# Patient Record
Sex: Female | Born: 1990 | Race: Black or African American | Hispanic: No | Marital: Single
Health system: Southern US, Community
[De-identification: ages and names within clinical notes are randomized; demographics above are authoritative.]

## PROBLEM LIST (undated history)

## (undated) ENCOUNTER — Inpatient Hospital Stay: Payer: Self-pay

## (undated) DIAGNOSIS — F3176 Bipolar disorder, in full remission, most recent episode depressed: Secondary | ICD-10-CM

## (undated) DIAGNOSIS — N926 Irregular menstruation, unspecified: Secondary | ICD-10-CM

## (undated) DIAGNOSIS — F329 Major depressive disorder, single episode, unspecified: Secondary | ICD-10-CM

## (undated) DIAGNOSIS — D649 Anemia, unspecified: Secondary | ICD-10-CM

## (undated) HISTORY — PX: NO PAST SURGERIES: SHX2092

## (undated) HISTORY — DX: Bipolar disorder, in full remission, most recent episode depressed: F31.76

## (undated) HISTORY — DX: Irregular menstruation, unspecified: N92.6

## (undated) HISTORY — DX: Major depressive disorder, single episode, unspecified: F32.9

---
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
## 2012-02-10 ENCOUNTER — Other Ambulatory Visit: Payer: Self-pay | Admitting: Internal Medicine

## 2012-02-10 MED ORDER — ALPRAZOLAM ER 0.5 MG PO TB24: 0.5000 mg | ORAL_TABLET | ORAL | Status: DC

## 2012-02-28 ENCOUNTER — Encounter: Payer: Medicaid Other | Admitting: Internal Medicine

## 2012-02-28 DIAGNOSIS — Z0289 Encounter for other administrative examinations: Secondary | ICD-10-CM

## 2012-04-14 ENCOUNTER — Other Ambulatory Visit: Payer: Self-pay | Admitting: Internal Medicine

## 2012-09-04 ENCOUNTER — Ambulatory Visit (INDEPENDENT_AMBULATORY_CARE_PROVIDER_SITE_OTHER): Payer: Medicaid Other | Admitting: Internal Medicine

## 2012-09-04 ENCOUNTER — Encounter: Payer: Self-pay | Admitting: Internal Medicine

## 2012-09-04 VITALS — BP 110/70 | HR 86 | Temp 98.0°F | Resp 16 | Ht 62.0 in | Wt 241.0 lb

## 2012-09-04 DIAGNOSIS — Z01419 Encounter for gynecological examination (general) (routine) without abnormal findings: Secondary | ICD-10-CM | POA: Insufficient documentation

## 2012-09-04 DIAGNOSIS — F419 Anxiety disorder, unspecified: Secondary | ICD-10-CM

## 2012-09-04 DIAGNOSIS — K2970 Gastritis, unspecified, without bleeding: Secondary | ICD-10-CM

## 2012-09-04 DIAGNOSIS — R109 Unspecified abdominal pain: Secondary | ICD-10-CM

## 2012-09-04 DIAGNOSIS — Z789 Other specified health status: Secondary | ICD-10-CM

## 2012-09-04 DIAGNOSIS — E669 Obesity, unspecified: Secondary | ICD-10-CM

## 2012-09-04 DIAGNOSIS — Z0000 Encounter for general adult medical examination without abnormal findings: Secondary | ICD-10-CM | POA: Insufficient documentation

## 2012-09-04 DIAGNOSIS — Z1151 Encounter for screening for human papillomavirus (HPV): Secondary | ICD-10-CM | POA: Insufficient documentation

## 2012-09-04 DIAGNOSIS — F411 Generalized anxiety disorder: Secondary | ICD-10-CM

## 2012-09-04 DIAGNOSIS — IMO0002 Reserved for concepts with insufficient information to code with codable children: Secondary | ICD-10-CM

## 2012-09-04 LAB — POCT URINE PREGNANCY: Preg Test, Ur: NEGATIVE

## 2012-09-04 MED ORDER — ESOMEPRAZOLE MAGNESIUM 40 MG PO CPDR: 40.0000 mg | DELAYED_RELEASE_CAPSULE | Freq: Every day | ORAL | Status: DC

## 2012-09-04 MED ORDER — LAMOTRIGINE 100 MG PO TABS: ORAL_TABLET | ORAL | Status: DC

## 2012-09-04 MED ORDER — FLUOXETINE HCL 10 MG PO CAPS: 30.0000 mg | ORAL_CAPSULE | Freq: Every day | ORAL | Status: DC

## 2012-09-04 MED ORDER — NORETHIN-ETH ESTRAD TRIPHASIC 0.5/1/0.5-35 MG-MCG PO TABS: 1.0000 | ORAL_TABLET | Freq: Every day | ORAL | Status: DC

## 2012-09-04 NOTE — Patient Instructions (Addendum)
Start the nexium in the morning daily for gastritis resume prozac and lamictal for your depression and bipolar disorder  If you continue to have trouble sleeping,  E mail me and I will [facility] a [facility]  Resume birth control  Follow up in one month   This is  One version of a  "Low GI"  Diet:  It will still lower your blood sugars and allow you to lose 4 to 8  lbs  per month if you follow it carefully.  Your goal with exercise is a minimum of 30 minutes of aerobic exercise 5 days per week (Walking does not count once it becomes easy!)    All of the foods can be found at grocery stores and in bulk at [employer].  The Atkins protein bars and shakes are available in more varieties at Target, WalMart and [employer]'s Foods.     7 AM Breakfast:  Choose from the following:  Low carbohydrate Protein  Shakes (I recommend the EAS AdvantEdge "Carb Control" shakes  Or the low carb shakes by Atkins.    2.5 carbs   Arnold's "Sandwhich Thin"toasted  w/ peanut butter (no jelly: about 20 net carbs  "Bagel Thin" with cream cheese and salmon: about 20 carbs   a scrambled egg/bacon/cheese burrito made with Mission's "carb balance" whole wheat tortilla  (about 10 net carbs )   Avoid cereal and bananas, oatmeal and cream of wheat and grits. They are loaded with carbohydrates!   10 AM: high protein snack  Protein bar by Atkins (the snack size, under 200 cal, usually < 6 net carbs).    A stick of cheese:  Around 1 carb,  100 cal     Dannon Light n Fit [place] Yogurt  (80 cal, 8 carbs)  Other so called "protein bars" and Greek yogurts tend to be loaded with carbohydrates.  Remember, in food advertising, the word "energy" is synonymous for " carbohydrate."  Lunch:   A Sandwich using the bread choices listed, Can use any  Eggs,  lunchmeat, grilled meat or canned tuna), avocado, regular mayo/mustard  and cheese.  A Salad using blue cheese, ranch,  Goddess or vinagrette,  No croutons or "confetti" and no "candied  nuts" but regular nuts OK.   No pretzels or chips.  Pickles and miniature sweet peppers are a good low carb alternative that provide a "crunch"  The bread is the only source of carbohydrate in a sandwich and  can be decreased by trying some of these alternatives to traditional loaf bread  Joseph's makes a pita bread and a flat bread that are 50 cal and 4 net carbs available at BJs and WalMart.  This can be toasted to use with hummous as well  Toufayan makes a low carb flatbread that's 100 cal and 9 net carbs available at [employer] and [employer] makes 2 sizes of  Low carb whole wheat tortilla  (The large one is 210 cal and 6 net carbs) Avoid "Low fat dressings, as well as Reyne Dumas and [address] dressings They are loaded with sugar!   3 PM/ Mid day  Snack:  Consider  1 ounce of  almonds, walnuts, pistachios, pecans, peanuts,  Macadamia nuts or a nut medley.  Avoid "granola"; the dried cranberries and raisins are loaded with carbohydrates. Mixed nuts as long as there are no raisins,  cranberries or dried fruit.     6 PM  Dinner:     Meat/fowl/fish with  a green salad, and either broccoli, cauliflower, green beans, spinach, brussel sprouts or  Lima beans. DO NOT BREAD THE PROTEIN!!      There is a low carb pasta by Dreamfield's that is acceptable and tastes great: only 5 digestible carbs/serving.( All grocery stores but BJs carry it )  Try Kai Levins Angelo's chicken piccata or chicken or eggplant parm over low carb pasta.(Lowes and BJs)   Clifton Custard Sanchez's "Carnitas" (pulled pork, no sauce,  0 carbs) or his beef pot roast to make a dinner burrito (at BJ's)  Pesto over low carb pasta (bj's sells a good quality pesto in the center refrigerated section of the deli   Whole wheat pasta is still full of digestible carbs and  Not as low in glycemic index as Dreamfield's.   Brown rice is still rice,  So skip the rice and noodles if you eat [place] or [place] (or at least limit to 1/2 cup)  9 PM snack :    Breyer's "low carb" fudgsicle or  ice cream bar (Carb Smart line), or  Weight Watcher's ice cream bar , or another "no sugar added" ice cream;  a serving of fresh berries/cherries with whipped cream   Cheese or DANNON'S LlGHT N FIT GREEK YOGURT  Avoid bananas, pineapple, grapes  and watermelon on a regular basis because they are high in sugar.  THINK OF THEM AS DESSERT  Remember that snack Substitutions should be less than 10 NET carbs per serving and meals < 20 carbs. Remember to subtract fiber grams to get the "net carbs."

## 2012-09-04 NOTE — Progress Notes (Signed)
Patient ID: Pamela Robertson, female   DOB: [birth year], 22 y.o.   MRN: [id number]    Subjective:     Pamela Robertson is a 22 y.o. female and is here for a comprehensive physical exam. The patient reports Recurrent abdominal pain and nausea both post prandially and early in the morning,  Accompanied by a weight gain of 50 lbs since October.  Uses aleve  And motrin occasionally,. Does not smoke.   has been out of her medicationsioant for at least two months for unclear reasons.  .  History   Social History  . Marital Status: Single    Spouse Name: N/A    Number of Children: N/A  . Years of Education: N/A   Occupational History  . Not on file.   Social History Main Topics  . Smoking status: Former Smoker -- 0.10 packs/day    Types: Cigarettes    Quit date: 05/04/2011  . Smokeless tobacco: Never Used     Comment: not daily,  less than pack/month  . Alcohol Use: No  . Drug Use: No  . Sexually Active: Yes -- Female partner(s)    Birth Control/ Protection:    Other Topics Concern  . Not on file   Social History Narrative  . No narrative on file   Health Maintenance  Topic Date Due  . Chlamydia Screening  02/03/2008  . Pap Smear  02/03/2011  . Tetanus/tdap  02/03/2012  . Influenza Vaccine  02/01/2013    The following portions of the patient's history were reviewed and updated as appropriate: allergies, past family history, past medical history, past social history, past surgical history and problem list.  Review of Systems A comprehensive review of systems was negative.   Objective:   BP 110/70  Pulse 86  Temp(Src) 98 F (36.7 C) (Oral)  Resp 16  Ht 5\' 2"  (1.575 m)  Wt 241 lb (109.317 kg)  BMI 44.07 kg/m2  SpO2 99%  LMP 04/06/2012  General Appearance:    Alert, cooperative, no distress, appears stated age  Head:    Normocephalic, without obvious abnormality, atraumatic  Eyes:    PERRL, conjunctiva/corneas clear, EOM's intact, fundi    benign, both eyes  Ears:     Normal TM's and external ear canals, both ears  Nose:   Nares normal, septum midline, mucosa normal, no drainage    or sinus tenderness  Throat:   Lips, mucosa, and tongue normal; teeth and gums normal  Neck:   Supple, symmetrical, trachea midline, no adenopathy;    thyroid:  no enlargement/tenderness/nodules; no carotid   bruit or JVD  Back:     Symmetric, no curvature, ROM normal, no CVA tenderness  Lungs:     Clear to auscultation bilaterally, respirations unlabored  Chest Wall:    No tenderness or deformity   Heart:    Regular rate and rhythm, S1 and S2 normal, no murmur, rub   or gallop  Breast Exam:    No tenderness, masses, or nipple abnormality  Abdomen:     Soft, non-tender, bowel sounds active all four quadrants,    no masses, no organomegaly  Genitalia:    Pelvic: cervix normal in appearance, external genitalia normal, no adnexal masses or tenderness, no cervical motion tenderness, rectovaginal septum normal, uterus normal size, shape, and consistency and vagina normal without discharge  Extremities:   Extremities normal, atraumatic, no cyanosis or edema  Pulses:   2+ and symmetric all extremities  Skin:   Skin color, texture, turgor  normal, no rashes or lesions  Lymph nodes:   Cervical, supraclavicular, and axillary nodes normal  Neurologic:   CNII-XII intact, normal strength, sensation and reflexes    throughout    Assessment:   Routine general medical examination at a health care facility Annual comprehensive exam was done including breast, pelvic and PAP smear. All screenings have been addressed .   Obesity (BMI 30-39.9) I have addressed  BMI and recommended a low glycemic index diet utilizing smaller more frequent meals to increase metabolism.  I have also recommended that patient start exercising with a goal of 30 minutes of aerobic exercise a minimum of 5 days per week. Screening for lipid disorders, thyroid and diabetes were doen today and hgba1c is 6.0.    Unspecified gastritis and gastroduodenitis without mention of hemorrhage Trial of nexium. LFTS and H Pylori were normal.    Updated Medication List Outpatient Encounter Prescriptions as of 09/04/2012  Medication Sig Dispense Refill  . ALPRAZolam (ALPRAZOLAM XR) 0.5 MG 24 hr tablet Take 1 tablet (0.5 mg total) by mouth every morning.  30 tablet  5  . FLUoxetine (PROZAC) 10 MG capsule Take 3 capsules (30 mg total) by mouth daily. Take 3 by mouth every am  90 capsule  5  . lamoTRIgine (LAMICTAL) 100 MG tablet TAKE 1 TABLET BY MOUTH  Daily in the evening  30 tablet  3  . Norethindrone-Ethinyl Estradiol Triphasic (TRI-NORINYL, 28,) 0.5/1/0.5-35 MG-MCG tablet Take 1 tablet by mouth daily.  1 Package  11  . omeprazole (PRILOSEC) 20 MG capsule Take 20 mg by mouth daily.      . [DISCONTINUED] FLUoxetine (PROZAC) 10 MG capsule Take 3 capsules (30 mg total) by mouth daily. Take 3 by mouth every am  90 capsule  5  . [DISCONTINUED] lamoTRIgine (LAMICTAL) 100 MG tablet TAKE 1 TABLET BY MOUTH FOR HEADACHE  30 tablet  3  . [DISCONTINUED] Norethindrone-Ethinyl Estradiol Triphasic (TRI-NORINYL, 28,) 0.5/1/0.5-35 MG-MCG tablet Take 1 tablet by mouth daily.  1 Package  11  . esomeprazole (NEXIUM) 40 MG capsule Take 1 capsule (40 mg total) by mouth daily.  30 capsule  3   No facility-administered encounter medications on file as of 09/04/2012.

## 2012-09-05 LAB — COMPREHENSIVE METABOLIC PANEL
Albumin: 4.4 g/dL (ref 3.5–5.2)
Alkaline Phosphatase: 73 U/L (ref 39–117)
BUN: 17 mg/dL (ref 6–23)
Creat: 0.76 mg/dL (ref 0.50–1.10)
Glucose, Bld: 105 mg/dL — ABNORMAL HIGH (ref 70–99)
Potassium: 4.3 mEq/L (ref 3.5–5.3)

## 2012-09-05 LAB — LIPID PANEL
HDL: 52 mg/dL (ref >39)
LDL Cholesterol: 74 mg/dL (ref 0–99)
Total CHOL/HDL Ratio: 2.7 Ratio
Triglycerides: 80 mg/dL (ref <150)
VLDL: 16 mg/dL (ref 0–40)

## 2012-09-05 LAB — HEMOGLOBIN A1C: Hgb A1c MFr Bld: 6 % — ABNORMAL HIGH (ref <5.7)

## 2012-09-06 ENCOUNTER — Encounter: Payer: Self-pay | Admitting: Internal Medicine

## 2012-09-06 DIAGNOSIS — K2970 Gastritis, unspecified, without bleeding: Secondary | ICD-10-CM | POA: Insufficient documentation

## 2012-09-06 DIAGNOSIS — K2990 Gastroduodenitis, unspecified, without bleeding: Secondary | ICD-10-CM | POA: Insufficient documentation

## 2012-09-06 NOTE — Assessment & Plan Note (Addendum)
I have addressed  BMI and recommended a low glycemic index diet utilizing smaller more frequent meals to increase metabolism.  I have also recommended that patient start exercising with a goal of 30 minutes of aerobic exercise a minimum of 5 days per week. Screening for lipid disorders, thyroid and diabetes were doen today and hgba1c is 6.0.

## 2012-09-06 NOTE — Assessment & Plan Note (Signed)
Annual comprehensive exam was done including breast, pelvic and PAP smear. All screenings have been addressed .  

## 2012-09-06 NOTE — Assessment & Plan Note (Addendum)
Trial of nexium. LFTS and H Pylori were normal.

## 2012-09-07 NOTE — Progress Notes (Signed)
Called pt LMOVM to return call.  

## 2012-09-09 ENCOUNTER — Telehealth: Payer: Self-pay | Admitting: Internal Medicine

## 2012-09-09 ENCOUNTER — Other Ambulatory Visit (HOSPITAL_COMMUNITY)
Admission: RE | Admit: 2012-09-09 | Discharge: 2012-09-09 | Disposition: A | Discharge status: Home / Self Care | Payer: Medicaid Other | Source: Ambulatory Visit | Attending: Internal Medicine | Admitting: Internal Medicine

## 2012-09-10 ENCOUNTER — Telehealth: Payer: Self-pay | Admitting: *Deleted

## 2012-09-10 NOTE — Telephone Encounter (Signed)
Prior Authorization criteria question have been faxed over

## 2012-09-14 ENCOUNTER — Encounter: Payer: Self-pay | Admitting: General Practice

## 2012-09-16 ENCOUNTER — Telehealth: Payer: Self-pay | Admitting: Internal Medicine

## 2012-09-16 NOTE — Telephone Encounter (Signed)
Pt came in and wanted to get her rx for sleeping meds. [facility]

## 2012-09-16 NOTE — Telephone Encounter (Signed)
Called patient to verify which medication she is requesting and was advised all she knows is it starts with a T she thought. Please Advise.

## 2012-09-17 NOTE — Telephone Encounter (Signed)
Patient notified as instructed by phone. Patient stated she would discuss this further at up coming appointment.

## 2012-09-17 NOTE — Telephone Encounter (Signed)
I HAVE NOT PRESCRIBED HER A SLEEPING MED. She is is mutliple medications for her bipolar disorder per her last visit she had not been taking them regularly, and they have all been refilled. She will have to be more specific

## 2012-09-20 ENCOUNTER — Telehealth: Payer: Self-pay | Admitting: Internal Medicine

## 2012-09-20 MED ORDER — OMEPRAZOLE 40 MG PO CPDR: 40.0000 mg | DELAYED_RELEASE_CAPSULE | Freq: Every day | ORAL | Status: DC

## 2012-09-20 NOTE — Telephone Encounter (Signed)
Medicard denied coverage of Nexium bc we had not tried an "accepted" PPI ,  rx for omeprazole 40 mg sent to pharmacy.  Take daily in the am 30 minutes prior to food and drink

## 2012-09-21 NOTE — Telephone Encounter (Signed)
Patient returned call and was notified as instructed.

## 2012-09-21 NOTE — Telephone Encounter (Signed)
Left message for patient to call office.  

## 2012-10-05 ENCOUNTER — Ambulatory Visit (INDEPENDENT_AMBULATORY_CARE_PROVIDER_SITE_OTHER): Payer: Medicaid Other | Admitting: Internal Medicine

## 2012-10-05 ENCOUNTER — Encounter: Payer: Self-pay | Admitting: Internal Medicine

## 2012-10-05 VITALS — BP 112/70 | HR 80 | Temp 97.9°F | Resp 14 | Wt 240.5 lb

## 2012-10-05 DIAGNOSIS — F411 Generalized anxiety disorder: Secondary | ICD-10-CM

## 2012-10-05 DIAGNOSIS — K2970 Gastritis, unspecified, without bleeding: Secondary | ICD-10-CM

## 2012-10-05 DIAGNOSIS — F419 Anxiety disorder, unspecified: Secondary | ICD-10-CM

## 2012-10-05 DIAGNOSIS — F3162 Bipolar disorder, current episode mixed, moderate: Secondary | ICD-10-CM

## 2012-10-05 DIAGNOSIS — K2990 Gastroduodenitis, unspecified, without bleeding: Secondary | ICD-10-CM

## 2012-10-05 DIAGNOSIS — F489 Nonpsychotic mental disorder, unspecified: Secondary | ICD-10-CM

## 2012-10-05 DIAGNOSIS — F5105 Insomnia due to other mental disorder: Secondary | ICD-10-CM

## 2012-10-05 DIAGNOSIS — E669 Obesity, unspecified: Secondary | ICD-10-CM

## 2012-10-05 MED ORDER — ALPRAZOLAM 0.5 MG PO TABS: 0.5000 mg | ORAL_TABLET | Freq: Every evening | ORAL | Status: DC | PRN

## 2012-10-05 NOTE — Progress Notes (Signed)
Patient ID: Pamela Robertson, female   DOB: [birth year], 22 y.o.   MRN: [id number]   Patient Active Problem List   Diagnosis Date Noted  . 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 Bipolar I disorder, most recent episode (or current) mixed, moderate 10/06/2012  . Unspecified gastritis and gastroduodenitis without mention of hemorrhage 09/06/2012  . Routine general medical examination at a health care facility 09/04/2012  . Obesity (BMI 30-39.9) 12/23/2011  . 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 Depression   . Menstrual irregularity     Subjective:  CC:   Chief Complaint  Patient presents with  . Follow-up    3 month    HPI:   Pamela Robertson a 22 y.o. female who presents with persistent insomnia.  She has persistent difficulty falling asleep due to excessive worrying.  Does not fall asleep until 3 am many nights.  Boyfriend has commented on occasional snoring but no obvious apneic spells.  Patient is taking her lamictal as directed for management of bipolar disorder.  Describes  episodes of hyperventilating during the day, accompanied by shortness of breath.  Occurs with exertion, going up stairs, etc.,  Does not exercise,  Has not lost any weight .    Past Medical History  Diagnosis Date  . Bipolar 1 disorder, depressed, full remission     controlled with lamictal and prozac  . Depression 2010    hosp for suical ideation at [facility]   . Menstrual irregularity     occurring every 2 or 3 months    History reviewed. No pertinent past surgical history.     The following portions of the patient's history were reviewed and updated as appropriate: Allergies, current medications, and problem list.    Review of Systems:   Patient denies headache, fevers, malaise, unintentional weight loss, skin rash, eye pain, sinus congestion and sinus pain, sore throat, dysphagia,  hemoptysis , cough, dyspnea, wheezing, chest pain, palpitations, orthopnea, edema, abdominal pain, nausea,  melena, diarrhea, constipation, flank pain, dysuria, hematuria, urinary  Frequency, nocturia, numbness, tingling, seizures,  Focal weakness, Loss of consciousness,  Tremor,  and suicidal ideation.     History   Social History  . Marital Status: Single    Spouse Name: N/A    Number of Children: N/A  . Years of Education: N/A   Occupational History  . Not on file.   Social History Main Topics  . Smoking status: Former Smoker -- 0.10 packs/day    Types: Cigarettes    Quit date: 05/04/2011  . Smokeless tobacco: Never Used     Comment: not daily,  less than pack/month  . Alcohol Use: No  . Drug Use: No  . Sexually Active: Yes -- Female partner(s)    Birth Control/ Protection:    Other Topics Concern  . Not on file   Social History Narrative  . No narrative on file    Objective:  BP 112/70  Pulse 80  Temp(Src) 97.9 F (36.6 C) (Oral)  Resp 14  Wt 240 lb 8 oz (109.09 kg)  BMI 43.98 kg/m2  SpO2 99%  LMP 09/30/2012  General appearance: alert, cooperative and appears stated age Ears: normal TM's and external ear canals both ears Throat: lips, mucosa, and tongue normal; teeth and gums normal Neck: no adenopathy, no carotid bruit, supple, symmetrical, trachea midline and thyroid not enlarged, symmetric, no tenderness/mass/nodules Back: symmetric, no curvature. ROM normal. No CVA tenderness. Lungs: clear to auscultation bilaterally Heart: regular rate and rhythm, S1, S2 normal,  no murmur, click, rub or gallop Abdomen: soft, non-tender; bowel sounds normal; no masses,  no organomegaly Pulses: 2+ and symmetric Skin: Skin color, texture, turgor normal. No rashes or lesions Lymph nodes: Cervical, supraclavicular, and axillary nodes normal.  Assessment and Plan:  Insomnia secondary to anxiety patient is already taking an SSRI and lamictal for bipolar disorder.  Trial of alprazolam for bedtime use.   Obesity (BMI 30-39.9) I have addressed  BMI and recommended a low glycemic  index diet utilizing smaller more frequent meals to increase metabolism.  I have also recommended that patient start exercising with a goal of 30 minutes of aerobic exercise a minimum of 5 days per week.    Unspecified gastritis and gastroduodenitis without mention of hemorrhage Resolved with use of omeprazole   Bipolar I disorder, most recent episode (or current) mixed, moderate Has resumed medications.  Encouraged to resume relationship with psychologist for talk therapy.    Updated Medication List Outpatient Encounter Prescriptions as of 10/05/2012  Medication Sig Dispense Refill  . FLUoxetine (PROZAC) 10 MG capsule Take 3 capsules (30 mg total) by mouth daily. Take 3 by mouth every am  90 capsule  5  . lamoTRIgine (LAMICTAL) 100 MG tablet TAKE 1 TABLET BY MOUTH  Daily in the evening  30 tablet  3  . Norethindrone-Ethinyl Estradiol Triphasic (TRI-NORINYL, 28,) 0.5/1/0.5-35 MG-MCG tablet Take 1 tablet by mouth daily.  1 Package  11  . omeprazole (PRILOSEC) 40 MG capsule Take 1 capsule (40 mg total) by mouth daily.  30 capsule  3  . ALPRAZolam (XANAX) 0.5 MG tablet Take 1 tablet (0.5 mg total) by mouth at bedtime as needed for sleep or anxiety.  30 tablet  3  . [DISCONTINUED] ALPRAZolam (ALPRAZOLAM XR) 0.5 MG 24 hr tablet Take 1 tablet (0.5 mg total) by mouth every morning.  30 tablet  5   No facility-administered encounter medications on file as of 10/05/2012.     No orders of the defined types were placed in this encounter.    Return in about 4 weeks (around 11/02/2012).

## 2012-10-05 NOTE — Patient Instructions (Addendum)
I am prescribing short acting xanax to take at bedtime to help you relax.  Please ask your boyfriend if you stop breathing when you sleep  I want you to lose 4 lbs this month

## 2012-10-06 ENCOUNTER — Encounter: Payer: Self-pay | Admitting: Internal Medicine

## 2012-10-06 DIAGNOSIS — F5105 Insomnia due to other mental disorder: Secondary | ICD-10-CM | POA: Insufficient documentation

## 2012-10-06 DIAGNOSIS — F3162 Bipolar disorder, current episode mixed, moderate: Secondary | ICD-10-CM | POA: Insufficient documentation

## 2012-10-06 DIAGNOSIS — F419 Anxiety disorder, unspecified: Secondary | ICD-10-CM | POA: Insufficient documentation

## 2012-10-06 NOTE — Assessment & Plan Note (Signed)
Has resumed medications.  Encouraged to resume relationship with psychologist for talk therapy.

## 2012-10-06 NOTE — Assessment & Plan Note (Signed)
I have addressed  BMI and recommended a low glycemic index diet utilizing smaller more frequent meals to increase metabolism.  I have also recommended that patient start exercising with a goal of 30 minutes of aerobic exercise a minimum of 5 days per week.  

## 2012-10-06 NOTE — Assessment & Plan Note (Signed)
patient is already taking an SSRI and lamictal for bipolar disorder.  Trial of alprazolam for bedtime use.

## 2012-10-06 NOTE — Assessment & Plan Note (Signed)
Resolved with use of omeprazole

## 2012-11-03 ENCOUNTER — Ambulatory Visit: Payer: Medicaid Other | Admitting: Internal Medicine

## 2013-01-18 ENCOUNTER — Encounter: Payer: Self-pay | Admitting: Internal Medicine

## 2013-01-19 ENCOUNTER — Ambulatory Visit: Payer: Medicaid Other | Admitting: Internal Medicine

## 2013-01-19 ENCOUNTER — Other Ambulatory Visit: Payer: Self-pay | Admitting: Internal Medicine

## 2013-01-19 ENCOUNTER — Telehealth: Payer: Self-pay | Admitting: Internal Medicine

## 2013-01-19 NOTE — Telephone Encounter (Signed)
Pamela Robertson,  Can you address this patient's "no show and cancellation " history?  She has had 2 no shows and cancelled todays visit on the same day.    Thank you.,  TT

## 2013-01-19 NOTE — Telephone Encounter (Deleted)
I have researched her No show's and late cancellation, I also sent patient a letter.

## 2013-01-19 NOTE — Telephone Encounter (Signed)
I sent patient letter advising her of the 2 late cancellations and 3 no shows that she has had.

## 2013-06-25 ENCOUNTER — Other Ambulatory Visit: Payer: Self-pay | Admitting: Internal Medicine

## 2013-06-28 NOTE — Telephone Encounter (Signed)
Okay to refill? 

## 2013-06-28 NOTE — Telephone Encounter (Signed)
Refill denied,  Patient has had 3 no shows and 2 cancellations thus far.  No refills until she is seen.

## 2013-06-30 ENCOUNTER — Other Ambulatory Visit: Payer: Self-pay | Admitting: Internal Medicine

## 2013-10-01 ENCOUNTER — Encounter: Payer: Medicaid Other | Admitting: Internal Medicine

## 2013-10-21 ENCOUNTER — Encounter: Payer: Self-pay | Admitting: Internal Medicine

## 2013-10-21 ENCOUNTER — Ambulatory Visit (INDEPENDENT_AMBULATORY_CARE_PROVIDER_SITE_OTHER): Payer: Medicaid Other | Admitting: Internal Medicine

## 2013-10-21 VITALS — BP 102/70 | HR 79 | Temp 97.8°F | Resp 16 | Ht 61.75 in | Wt 249.5 lb

## 2013-10-21 DIAGNOSIS — N926 Irregular menstruation, unspecified: Secondary | ICD-10-CM

## 2013-10-21 DIAGNOSIS — R739 Hyperglycemia, unspecified: Secondary | ICD-10-CM

## 2013-10-21 DIAGNOSIS — F3162 Bipolar disorder, current episode mixed, moderate: Secondary | ICD-10-CM

## 2013-10-21 DIAGNOSIS — R7309 Other abnormal glucose: Secondary | ICD-10-CM

## 2013-10-21 DIAGNOSIS — E669 Obesity, unspecified: Secondary | ICD-10-CM

## 2013-10-21 DIAGNOSIS — F411 Generalized anxiety disorder: Secondary | ICD-10-CM

## 2013-10-21 DIAGNOSIS — F419 Anxiety disorder, unspecified: Secondary | ICD-10-CM

## 2013-10-21 DIAGNOSIS — Z0000 Encounter for general adult medical examination without abnormal findings: Secondary | ICD-10-CM

## 2013-10-21 LAB — COMPREHENSIVE METABOLIC PANEL
ALT: 19 U/L (ref 0–35)
AST: 17 U/L (ref 0–37)
Albumin: 3.8 g/dL (ref 3.5–5.2)
Alkaline Phosphatase: 66 U/L (ref 39–117)
BILIRUBIN TOTAL: 0.3 mg/dL (ref 0.2–1.2)
BUN: 16 mg/dL (ref 6–23)
CO2: 29 meq/L (ref 19–32)
CREATININE: 0.8 mg/dL (ref 0.4–1.2)
Calcium: 9.4 mg/dL (ref 8.4–10.5)
Chloride: 103 mEq/L (ref 96–112)
GFR: 116.77 mL/min (ref >60.00)
Glucose, Bld: 88 mg/dL (ref 70–99)
Potassium: 4.2 mEq/L (ref 3.5–5.1)
Sodium: 138 mEq/L (ref 135–145)
Total Protein: 7.4 g/dL (ref 6.0–8.3)

## 2013-10-21 LAB — LIPID PANEL
CHOL/HDL RATIO: 3
Cholesterol: 148 mg/dL (ref 0–200)
HDL: 45.2 mg/dL (ref >39.00)
LDL CALC: 95 mg/dL (ref 0–99)
Triglycerides: 40 mg/dL (ref 0.0–149.0)
VLDL: 8 mg/dL (ref 0.0–40.0)

## 2013-10-21 LAB — HEMOGLOBIN A1C: HEMOGLOBIN A1C: 5.9 % (ref 4.6–6.5)

## 2013-10-21 LAB — TESTOSTERONE: Testosterone: 64.96 ng/dL (ref 10.00–70.00)

## 2013-10-21 LAB — TSH: TSH: 1.92 u[IU]/mL (ref 0.35–5.50)

## 2013-10-21 MED ORDER — MOMETASONE FUROATE 0.1 % EX OINT: TOPICAL_OINTMENT | Freq: Every day | CUTANEOUS | Status: DC

## 2013-10-21 MED ORDER — FLUOXETINE HCL 10 MG PO CAPS: ORAL_CAPSULE | ORAL | Status: DC

## 2013-10-21 MED ORDER — PHENTERMINE HCL 37.5 MG PO TABS: ORAL_TABLET | ORAL | Status: DC

## 2013-10-21 MED ORDER — TRAZODONE HCL 50 MG PO TABS: 25.0000 mg | ORAL_TABLET | Freq: Every evening | ORAL | Status: DC | PRN

## 2013-10-21 MED ORDER — LAMOTRIGINE 100 MG PO TABS: ORAL_TABLET | ORAL | Status: DC

## 2013-10-21 NOTE — Progress Notes (Signed)
Patient ID: Pamela Robertson, female   DOB: 01/16/1991, 22 y.o.   MRN: [id number]    Subjective:     Pamela Robertson is a 23 y.o. female and is here for a comprehensive physical exam. The patient reports problems - as follows:  Has been amenorrheic for the past year  Currently menstruating since Monday .  Stopped birth control She lost 16 lbs and gained it back.  FH of obesity.. Requesting pharmacologic assistance     .  History   Social History  . Marital Status: Single    Spouse Name: N/A    Number of Children: N/A  . Years of Education: N/A   Occupational History  . Not on file.   Social History Main Topics  . Smoking status: Former Smoker -- 0.10 packs/day    Types: Cigarettes    Quit date: 05/04/2011  . Smokeless tobacco: Never Used     Comment: not daily,  less than pack/month  . Alcohol Use: No  . Drug Use: No  . Sexual Activity: Yes    Partners: Male    Birth Control/ Protection:    Other Topics Concern  . Not on file   Social History Narrative  . No narrative on file   Health Maintenance  Topic Date Due  . Chlamydia Screening  02/03/2008  . Tetanus/tdap  02/03/2012  . Influenza Vaccine  01/01/2014  . Pap Smear  09/05/2015    The following portions of the patient's history were reviewed and updated as appropriate: allergies, current medications, past family history, past medical history, past social history, past surgical history and problem list.  Review of Systems A comprehensive review of systems was negative.   Objective:   BP 102/70  Pulse 79  Temp(Src) 97.8 F (36.6 C) (Oral)  Resp 16  Ht 5' 1.75" (1.568 m)  Wt 249 lb 8 oz (113.172 kg)  BMI 46.03 kg/m2  SpO2 99%  LMP 10/18/2013  General appearance: alert, cooperative and appears stated age Head: Normocephalic, without obvious abnormality, atraumatic Eyes: conjunctivae/corneas clear. PERRL, EOM's intact. Fundi benign. Ears: normal TM's and external ear canals both ears Nose: Nares  normal. Septum midline. Mucosa normal. No drainage or sinus tenderness. Throat: lips, mucosa, and tongue normal; teeth and gums normal Neck: no adenopathy, no carotid bruit, no JVD, supple, symmetrical, trachea midline and thyroid not enlarged, symmetric, no tenderness/mass/nodules Lungs: clear to auscultation bilaterally Breasts: normal appearance, no masses or tenderness Heart: regular rate and rhythm, S1, S2 normal, no murmur, click, rub or gallop Abdomen: soft, non-tender; bowel sounds normal; no masses,  no organomegaly Extremities: extremities normal, atraumatic, no cyanosis or edema Pulses: 2+ and symmetric Skin: Skin color, texture, turgor normal. No rashes or lesions Neurologic: Alert and oriented X 3, normal strength and tone. Normal symmetric reflexes. Normal coordination and gait.    Assessment and Plan:    Routine general medical examination at a health care facility Annual comprehensive exam was done including breast, excluding pelvic and PAP smear. All screenings have been addressed .   Obesity (BMI 30-39.9) She has had difficulty losing weight due to increased appetite and is requesting a trial of  Phentermine.  She is aware of the possible side effects and risks and understands that    The medication will be discontinued if she has not lost 5% of her body weight over the next 3 months, which , based on today's weight is 14 lbs.  Bipolar I disorder, most recent episode (or current) mixed,  moderate She has had a lapse in medication due to loss of follow up .  Resuming zoloft and lamictal.   Updated Medication List Outpatient Encounter Prescriptions as of 10/21/2013  Medication Sig  . ALPRAZolam (XANAX) 0.5 MG tablet Take 1 tablet (0.5 mg total) by mouth at bedtime as needed for sleep or anxiety.  Marland Kitchen. FLUoxetine (PROZAC) 10 MG capsule Take 3 by mouth every am  . lamoTRIgine (LAMICTAL) 100 MG tablet TAKE 1 TABLET BY MOUTH  Daily in the evening  . omeprazole (PRILOSEC) 40 MG  capsule Take 1 capsule (40 mg total) by mouth daily.  . [DISCONTINUED] FLUoxetine (PROZAC) 10 MG capsule Take 3 capsules (30 mg total) by mouth daily. Take 3 by mouth every am  . [DISCONTINUED] lamoTRIgine (LAMICTAL) 100 MG tablet TAKE 1 TABLET BY MOUTH  Daily in the evening  . mometasone (ELOCON) 0.1 % ointment Apply topically daily. To ear canal  . Norethindrone-Ethinyl Estradiol Triphasic (TRI-NORINYL, 28,) 0.5/1/0.5-35 MG-MCG tablet Take 1 tablet by mouth daily.  . phentermine (ADIPEX-P) 37.5 MG tablet 1/2 tablet daily at 8 am and 2 pm  . traZODone (DESYREL) 50 MG tablet Take 0.5-1 tablets (25-50 mg total) by mouth at bedtime as needed for sleep.

## 2013-10-21 NOTE — Progress Notes (Signed)
Pre-visit discussion using our clinic review tool. No additional management support is needed unless otherwise documented below in the visit note.  

## 2013-10-21 NOTE — Patient Instructions (Addendum)
I am starting you on phentermine to help you lose weight,  Our goal with phentermine is a 12 lb wt loss over the next 3 months   Take 1/2 tablet in the morning and 1/2 tablet after lynch (by 2pM)  Have your vital signs rechecked one week after you start the medication and call the results to the office   We are resuming your zoloft and lamictal.  We will add trazodone at bedtime for your insomnia      Return in one month  Phentermine tablets or capsules What is this medicine? PHENTERMINE (FEN ter meen) decreases your appetite. It is used with a reduced calorie diet and exercise to help you lose weight. This medicine may be used for other purposes; ask your health care provider or pharmacist if you have questions. COMMON BRAND NAME(S): Adipex-P, Atti-Plex P , Atti-Plex P Spansule , Fastin, Pro-Fast, Tara-8  What should I tell my health care provider before I take this medicine? They need to know if you have any of these conditions: -agitation -glaucoma -heart disease -high blood pressure -history of substance abuse -lung disease called Primary Pulmonary Hypertension (PPH) -taken an MAOI like Carbex, Eldepryl, Marplan, Nardil, or Parnate in last 14 days -thyroid disease -an unusual or allergic reaction to phentermine, other medicines, foods, dyes, or preservatives -pregnant or trying to get pregnant -breast-feeding How should I use this medicine? Take this medicine by mouth with a glass of water. Follow the directions on the prescription label. This medicine is usually taken 30 minutes before or 1 to 2 hours after breakfast. Avoid taking this medicine in the evening. It may interfere with sleep. Take your doses at regular intervals. Do not take your medicine more often than directed. Talk to your pediatrician regarding the use of this medicine in children. Special care may be needed. Overdosage: If you think you have taken too much of this medicine contact a poison control center or  emergency room at once. NOTE: This medicine is only for you. Do not share this medicine with others. What if I miss a dose? If you miss a dose, take it as soon as you can. If it is almost time for your next dose, take only that dose. Do not take double or extra doses. What may interact with this medicine? Do not take this medicine with any of the following medications: -duloxetine -MAOIs like Carbex, Eldepryl, Marplan, Nardil, and Parnate -medicines for colds or breathing difficulties like pseudoephedrine or phenylephrine -procarbazine -sibutramine -SSRIs like citalopram, escitalopram, fluoxetine, fluvoxamine, paroxetine, and sertraline -stimulants like dexmethylphenidate, methylphenidate or modafinil -venlafaxine This medicine may also interact with the following medications: -medicines for diabetes This list may not describe all possible interactions. Give your health care provider a list of all the medicines, herbs, non-prescription drugs, or dietary supplements you use. Also tell them if you smoke, drink alcohol, or use illegal drugs. Some items may interact with your medicine. What should I watch for while using this medicine? Notify your physician immediately if you become short of breath while doing your normal activities. Do not take this medicine within 6 hours of bedtime. It can keep you from getting to sleep. Avoid drinks that contain caffeine and try to stick to a regular bedtime every night. This medicine was intended to be used in addition to a healthy diet and exercise. The best results are achieved this way. This medicine is only indicated for short-term use. Eventually your weight loss may level out. At that point, the  drug will only help you maintain your new weight. Do not increase or in any way change your dose without consulting your doctor. You may get drowsy or dizzy. Do not drive, use machinery, or do anything that needs mental alertness until you know how this medicine  affects you. Do not stand or sit up quickly, especially if you are an older patient. This reduces the risk of dizzy or fainting spells. Alcohol may increase dizziness and drowsiness. Avoid alcoholic drinks. What side effects may I notice from receiving this medicine? Side effects that you should report to your doctor or health care professional as soon as possible: -chest pain, palpitations -depression or severe changes in mood -increased blood pressure -irritability -nervousness or restlessness -severe dizziness -shortness of breath -problems urinating -unusual swelling of the legs -vomiting Side effects that usually do not require medical attention (report to your doctor or health care professional if they continue or are bothersome): -blurred vision or other eye problems -changes in sexual ability or desire -constipation or diarrhea -difficulty sleeping -dry mouth or unpleasant taste -headache -nausea This list may not describe all possible side effects. Call your doctor for medical advice about side effects. You may report side effects to FDA at 1-800-FDA-1088. Where should I keep my medicine? Keep out of the reach of children. This medicine can be abused. Keep your medicine in a safe place to protect it from theft. Do not share this medicine with anyone. Selling or giving away this medicine is dangerous and against the law. Store at room temperature between 20 and 25 degrees C (68 and 77 degrees F). Keep container tightly closed. Throw away any unused medicine after the expiration date. NOTE: This sheet is a summary. It may not cover all possible information. If you have questions about this medicine, talk to your doctor, pharmacist, or health care provider.  2014, Elsevier/Gold Standard. (2010-07-04 11:02:44)   This is  One version of a  "Low GI"  Diet:  It will still lower your blood sugars and allow you to lose 4 to 8  lbs  per month if you follow it carefully.  Your goal with  exercise is a minimum of 30 minutes of aerobic exercise 5 days per week (Walking does not count once it becomes easy!)    All of the foods can be found at grocery stores and in bulk at [employer].  The Atkins protein bars and shakes are available in more varieties at [employer], [employer] and [employer].     7 AM Breakfast:  Choose from the following:  Low carbohydrate Protein  Shakes (I recommend the [employer] "Carb Control" shakes  Or the low carb shakes by Atkins.    2.5 carbs   Arnold's "Sandwhich Thin"toasted  w/ peanut butter (no jelly: about 20 net carbs  "Bagel Thin" with cream cheese and salmon: about 20 carbs   a scrambled egg/bacon/cheese burrito made with Mission's "carb balance" whole wheat tortilla  (about 10 net carbs )   Avoid cereal and bananas, oatmeal and cream of wheat and grits. They are loaded with carbohydrates!   10 AM: high protein snack  Protein bar by Atkins (the snack size, under 200 cal, usually < 6 net carbs).    A stick of cheese:  Around 1 carb,  100 cal     Dannon Light n Fit [place] Yogurt  (80 cal, 8 carbs)  Other so called "protein bars" and Greek yogurts tend to be loaded with carbohydrates.  Remember, in food advertising, the word "energy" is synonymous for " carbohydrate."  Lunch:   A Sandwich using the bread choices listed, Can use any  Eggs,  lunchmeat, grilled meat or canned tuna), avocado, regular mayo/mustard  and cheese.  A Salad using blue cheese, ranch,  Goddess or vinagrette,  No croutons or "confetti" and no "candied nuts" but regular nuts OK.   No pretzels or chips.  Pickles and miniature sweet peppers are a good low carb alternative that provide a "crunch"  The bread is the only source of carbohydrate in a sandwich and  can be decreased by trying some of these alternatives to traditional loaf bread  Joseph's makes a pita bread and a flat bread that are 50 cal and 4 net carbs available at [employer]Js and [employer].  This can be toasted to use with  hummous as well  Toufayan makes a low carb flatbread that's 100 cal and 9 net carbs available at [employer] and [employer] makes 2 sizes of  Low carb whole wheat tortilla  (The large one is 210 cal and 6 net carbs) Avoid "Low fat dressings, as well as Reyne Dumas and [address] dressings They are loaded with sugar!   3 PM/ Mid day  Snack:  Consider  1 ounce of  almonds, walnuts, pistachios, pecans, peanuts,  Macadamia nuts or a nut medley.  Avoid "granola"; the dried cranberries and raisins are loaded with carbohydrates. Mixed nuts as long as there are no raisins,  cranberries or dried fruit.    Try the prosciutto/mozzarella cheese sticks by F[employer]  In deli /backery section   High protein      6 PM  Dinner:     Meat/fowl/fish with a green salad, and either broccoli, cauliflower, green beans, spinach, brussel sprouts or  Lima beans. DO NOT BREAD THE PROTEIN!!      There is a low carb pasta by Dreamfield's that is acceptable and tastes great: only 5 digestible carbs/serving.( All grocery stores but BJs carry it )  Try Kai Levins Angelo's chicken piccata or chicken or eggplant parm over low carb pasta.(Lowes and BJs)   Clifton Custard Sanchez's "Carnitas" (pulled pork, no sauce,  0 carbs) or his beef pot roast to make a dinner burrito (at [employer]J's)  Pesto over low carb pasta (bj's sells a good quality pesto in the center refrigerated section of the deli   Try satueeing  Roosvelt Harps with mushroooms  Whole wheat pasta is still full of digestible carbs and  Not as low in glycemic index as Dreamfield's.   Brown rice is still rice,  So skip the rice and noodles if you eat [place] or [place] (or at least limit to 1/2 cup)  9 PM snack :   Breyer's "low carb" fudgsicle or  ice cream bar (Carb Smart line), or  Weight Watcher's ice cream bar , or another "no sugar added" ice cream;  a serving of fresh berries/cherries with whipped cream   Cheese or DANNON'S LlGHT N FIT GREEK YOGURT  8 ounces of [place] unsweetened  almond/cococunut milk    Avoid bananas, pineapple, grapes  and watermelon on a regular basis because they are high in sugar.  THINK OF THEM AS DESSERT  Remember that snack Substitutions should be less than 10 NET carbs per serving and meals < 20 carbs. Remember to subtract fiber grams to get the "net carbs."

## 2013-10-22 ENCOUNTER — Encounter: Payer: Self-pay | Admitting: *Deleted

## 2013-10-22 LAB — DHEA-SULFATE: DHEA-SO4: 372 ug/dL (ref 35–430)

## 2013-10-24 NOTE — Assessment & Plan Note (Signed)
Annual comprehensive exam was done including breast, excluding pelvic and PAP smear. All screenings have been addressed .  

## 2013-10-24 NOTE — Assessment & Plan Note (Signed)
She has had difficulty losing weight due to increased appetite and is requesting a trial of  Phentermine.  She is aware of the possible side effects and risks and understands that    The medication will be discontinued if she has not lost 5% of her body weight over the next 3 months, which , based on today's weight is 14 lbs. 

## 2013-10-24 NOTE — Assessment & Plan Note (Signed)
She has had a lapse in medication due to loss of follow up .  Resuming zoloft and lamictal.

## 2013-10-26 ENCOUNTER — Other Ambulatory Visit: Payer: Self-pay | Admitting: *Deleted

## 2013-10-26 MED ORDER — ALPRAZOLAM 0.5 MG PO TABS: 0.5000 mg | ORAL_TABLET | Freq: Every evening | ORAL | Status: DC | PRN

## 2013-10-26 NOTE — Telephone Encounter (Signed)
Rx faxed to pharmacy  

## 2013-10-26 NOTE — Telephone Encounter (Signed)
Ok to refill,  printed rx  

## 2013-10-26 NOTE — Telephone Encounter (Signed)
Last OV 10/21/13 refill?

## 2013-11-29 ENCOUNTER — Ambulatory Visit: Payer: Medicaid Other | Admitting: Internal Medicine

## 2013-12-06 ENCOUNTER — Ambulatory Visit: Payer: Medicaid Other | Admitting: Internal Medicine

## 2015-09-27 LAB — OB RESULTS CONSOLE RPR: RPR: NONREACTIVE

## 2015-09-27 LAB — OB RESULTS CONSOLE HIV ANTIBODY (ROUTINE TESTING): HIV: NONREACTIVE

## 2015-09-27 LAB — OB RESULTS CONSOLE HEPATITIS B SURFACE ANTIGEN: Hepatitis B Surface Ag: NEGATIVE

## 2015-09-27 LAB — OB RESULTS CONSOLE VARICELLA ZOSTER ANTIBODY, IGG: Varicella: IMMUNE

## 2015-09-27 LAB — OB RESULTS CONSOLE RUBELLA ANTIBODY, IGM: Rubella: IMMUNE

## 2015-09-28 ENCOUNTER — Other Ambulatory Visit: Payer: Self-pay | Admitting: Obstetrics and Gynecology

## 2015-09-28 DIAGNOSIS — Z369 Encounter for antenatal screening, unspecified: Secondary | ICD-10-CM

## 2015-10-02 ENCOUNTER — Ambulatory Visit
Admission: RE | Admit: 2015-10-02 | Discharge: 2015-10-02 | Disposition: A | Discharge status: Home / Self Care | Payer: Medicaid Other | Source: Ambulatory Visit | Attending: Obstetrics and Gynecology | Admitting: Obstetrics and Gynecology

## 2015-10-02 ENCOUNTER — Ambulatory Visit (HOSPITAL_BASED_OUTPATIENT_CLINIC_OR_DEPARTMENT_OTHER)
Admission: RE | Admit: 2015-10-02 | Discharge: 2015-10-02 | Disposition: A | Discharge status: Home / Self Care | Payer: Medicaid Other | Source: Ambulatory Visit | Attending: Obstetrics & Gynecology | Admitting: Obstetrics & Gynecology

## 2015-10-02 VITALS — BP 114/73 | HR 84 | Temp 97.4°F | Resp 18 | Ht 61.0 in | Wt 229.0 lb

## 2015-10-02 DIAGNOSIS — Z820 Family history of epilepsy and other diseases of the nervous system: Secondary | ICD-10-CM | POA: Diagnosis not present

## 2015-10-02 DIAGNOSIS — Z369 Encounter for antenatal screening, unspecified: Secondary | ICD-10-CM

## 2015-10-02 DIAGNOSIS — Z36 Encounter for antenatal screening of mother: Secondary | ICD-10-CM | POA: Diagnosis present

## 2015-10-02 DIAGNOSIS — Z8279 Family history of other congenital malformations, deformations and chromosomal abnormalities: Secondary | ICD-10-CM

## 2015-10-02 DIAGNOSIS — Z3490 Encounter for supervision of normal pregnancy, unspecified, unspecified trimester: Secondary | ICD-10-CM

## 2015-10-02 DIAGNOSIS — Z3401 Encounter for supervision of normal first pregnancy, first trimester: Secondary | ICD-10-CM | POA: Diagnosis present

## 2015-10-02 DIAGNOSIS — Q8500 Neurofibromatosis, unspecified: Secondary | ICD-10-CM | POA: Diagnosis not present

## 2015-10-02 DIAGNOSIS — Z3A10 10 weeks gestation of pregnancy: Secondary | ICD-10-CM | POA: Diagnosis not present

## 2015-10-02 NOTE — Progress Notes (Addendum)
Referring Provider: [facility] 45 minute consultation  Pamela Robertson [name] for genetic counseling due to a family history of Neurofibromatosis and to review routine prenatal screening and testing options. This note summarizes the information we discussed.   We first obtained a detailed family history. Pamela Robertson reported that her partner, Pamela Robertson, his mother and several other maternal relatives have been diagnosed with von Recklinghausen Neurofibromatosis (NF1). His mother was here for the appointment and was the main historian regarding this history. She reported that they both have multiple caf-au-lait spots, freckling, Lisch nodules and neurofibromas. She and Pamela Robertson were both diagnosed as a child and are [name] by [name]. She said that she underwent genetic testing many years ago, but that Pamela Robertson's diagnosis was based upon clinical features and that no genetic testing has been performed.  There were no other persons with birth defects, developmental disabilities, recurrent miscarriages or known genetic conditions in the family.  Pamela Robertson has had no complications or exposures in the pregnancy which are expected to increase the risk for birth defects.   To review, NF1 is a condition characterized by the presence of multiple caf-au-lait spots, axillary and inguinal freckling, multiple cutaneous neurofibromas, and iris Lisch nodules. Many patients will also have plexiform neurofibromas, optic nerve and other central nervous system gliomas, malignant peripheral nerve sheath tumors or other health concerns. Up to 50% of persons with NF will have some learning difficulties. The degree of severity of NF can vary a great deal from one person to another, even within the same family. NF is inherited as a dominant genetic condition. To review, genes are the instructions that direct the growth and development of our bodies.  We all have two copies of each of our genes, one from our mother and one from our father. Genes are packaged into chromosomes. There are 46 chromosomes in each cell, matched up into 23 pairs. The gene that causes NF, called NF1, is located on chromosome number 1117. When there is a change one of the copies of this gene, then a person will develop the symptoms of NF. This is called a dominant trait. If a parent has NF, there is a 50% chance they will pass on the normal copy of the NF1 gene to each of their children and a 50% chance they would pass on the copy with the change that causes the condition. We expect >97% of persons with the gene change for NF to have caf-au-lait spots by age 9 years and >93% of persons with the gene to have neurofibromas by age 23 years.   To be diagnosed with NF1, a person must meet certain diagnostic criteria. For a child whose parent has NF, it is only necessary to have one clinical feature of NF to be diagnosed with the condition (such as caf-au-lait spots). Most individuals with NF are diagnosed only by physical examination, not by genetic testing, as was reported by the patients mother in law. We are happy to review medical records on Pamela Robertson and his mother to find out if genetic testing was done if desired. [name] individual were to be interested in prenatal testing for pregnancy, then it would be necessary to have genetic test results first on the affected parent. If a mutation were identified, then testing on the pregnancy would be available through amniocentesis after [redacted] weeks gestation or CVS from 10-13 weeks. Amniocentesis is an invasive diagnostic test that uses a needle  to remove a sample of amniotic fluid. There are fetal cells in the amniotic fluid which could be tested for the genetic change. The risk of complications including miscarriage related to the amniocentesis procedure is estimated to be 1 in 200. While prenatal testing could be used to determine if a  baby had the gene change to cause NF, it is not able to predict the severity of the condition.   The baby can also be assessed after delivery for signs of NF1. After delivery, we encourage Pamela Robertson. Pamela Robertson to make her pediatrician aware of this history and to consider a consultation with Medical Genetics to have the baby examined for features of NF. [name] can be reached by calling [phone]. The family could also contact Pamela Robertson's doctors at [name] for evaluation of the child after birth.  We provided a medical record release form for Pamela Robertson's mother, Pamela Robertson (dob 10/30/1970), so obtain records from [name] regarding genetic testing on her.  When those records are received, we will make append this note with what is learned.  In addition to discussing the family history, the following routine screening test options were discussed:   First trimester screening, which includes nuchal translucency ultrasound screen and first trimester maternal serum marker screening. The nuchal translucency has approximately an 80% detection rate for Down syndrome and can be positive for other chromosome abnormalities as well as heart defects. When combined with a maternal serum marker screening, the detection rate is up to 90% for Down syndrome and up to 97% for trisomy 18.   Maternal serum marker screening, a blood test that measures pregnancy proteins, can provide risk assessments for Down syndrome, trisomy 18, and open neural tube defects (spina bifida, anencephaly). Because it does not directly examine the fetus, it cannot positively diagnose or rule out these problems.  Targeted ultrasound uses high frequency sound waves to create an image of the developing fetus. An ultrasound is often recommended as a routine means of evaluating the pregnancy. It is also used to screen for fetal anatomy problems (for example, a heart defect) that might be suggestive of a chromosomal or other  abnormality.   Should these screening tests indicate an increased concern, then the following diagnostic options would be offered:  The chorionic villus sampling procedure is available for first trimester chromosome analysis. This involves the withdrawal of a small amount of chorionic villi (tissue from the developing placenta). Risk of pregnancy loss is estimated to be approximately 1 in 200 to 1 in 100 (0.5 to 1%). There is approximately a 1% (1 in 100) chance that the CVS chromosome results will be unclear. Chorionic villi cannot be tested for neural tube defects.   Amniocentesis involves the removal of a small amount of amniotic fluid from the sac surrounding the fetus with the use of a thin needle inserted through the maternal abdomen and uterus. Ultrasound guidance is used throughout the procedure. Fetal cells from amniotic fluid are directly evaluated and > 99.5% of chromosome problems and > 98% of open neural tube defects can be detected. This procedure is generally performed after the 15th week of pregnancy. The main risks to this procedure include complications leading to miscarriage in less than 1 in 200 cases (0.5%).  Circulating cell free fetal DNA testing may be used to determine whether or not the baby may have either Down syndrome, trisomy 13, or trisomy 33. This test utilizes a maternal blood sample and DNA sequencing technology to isolate circulating cell  free fetal DNA from maternal plasma. The fetal DNA can then be analyzed for DNA sequences that are derived from the three most common chromosomes involved in aneuploidy, chromosomes 13, 18, and 21. If the overall amount of DNA is greater than the expected level for any of these chromosomes, aneuploidy is suspected. While we do not consider it a replacement for invasive testing and karyotype analysis, a negative result from this testing would be reassuring, though not a guarantee of a normal chromosome complement for the baby. An  abnormal result is certainly suggestive of an abnormal chromosome complement, though we would still recommend CVS or amniocentesis to confirm any findings from this testing.   The patient is of [place] and [place] ancestry and the father of the baby is African Naval architect. Given their backgrounds, Cystic Fibrosis and Spinal Muscular Atrophy (SMA) screening were also discussed with the patient. Both conditions are recessive, which means that both parents must be carriers in order to have a child with the disease. Cystic fibrosis (CF) is one of the most common genetic conditions in persons of Caucasian ancestry. This condition occurs in approximately 1 in 2,500 Caucasian persons and results in thickened secretions in the lungs, digestive, and reproductive systems. For a baby to be at risk for having CF, both of the parents must be carriers for this condition. Approximately 1 in 64 Caucasian persons is a carrier for CF. Current carrier testing looks for the most common mutations in the gene for CF and can detect approximately 90% of carriers in the Caucasian population. This means that the carrier screening can greatly reduce, but cannot eliminate, the chance for an individual to have a child with CF. If an individual is found to be a carrier for CF, then carrier testing would be available for the partner. As part of [place]'s newborn screening profile, all babies born in the state of [place] will have a two-tier screening process. Specimens are first tested to determine the concentration of immunoreactive trypsinogen (IRT). The top 5% of specimens with the highest IRT values then undergo DNA testing using a panel of over 40 common CF mutations. SMA is a neurodegenerative disorder that leads to atrophy of skeletal muscle and overall weakness. This condition is also more prevalent in the Caucasian population, with 1 in 40-1 in 60 persons being a carrier and 1 in 6,000-1 in 10,000  children being affected. There are multiple forms of the disease, with some causing death in infancy to other forms with survival into adulthood. The genetics of SMA is complex, but carrier screening can detect up to 95% of carriers in the Caucasian population. Similar to CF, a negative result can greatly reduce, but cannot eliminate, the chance to have a child with SMA. Sickle cell carrier testing was also discussed. Pamela Robertson. Pamela Robertson has a normal MCV and negative hemoglobin solubility. No additional testing was seen in the labs when reviewing the chart today.  We would recommend a hemoglobin electrophoresis for screening for sickle cell trait and other hemoglobinopathies if that testing has not been performed previously, as it is more complete than solubility and may be detect other clinically significant hemoglobin variants.  After consideration of the options, the patient elected to proceed with first trimester screening.  She does not desire prenatal diagnosis for NF in this pregnancy.  An ultrasound at the time of the visit revealed the patient to be [redacted] weeks gestation.  Therefore, NT could not be obtained today.  Blood was drawn  for biochemistry for the first trimester screening to allow for instant risk assessment at the time of ultrasound in 2 weeks.  The patient was encouraged to call with questions or concerns. We can be contacted at [phone].  Pamela Anderson, Pamela Robertson, Pamela Robertson   Addendum 10/16/2015:  Pamela Robertson. Pamela Robertson was seen again today for a follow up ultrasound for the NT measurement.  Because she previously had the biochemistry drawn, we were able to provide a risk assessment while she was here today.  The results of the first trimester screening showed an increased chance for Down syndrome of 1 in 148.  The chance for trisomy 18/13 is less than 1 in 10,000.    We reviewed the availability of cell free fetal DNA testing from maternal blood to determine whether or not the baby may have  either Down syndrome, trisomy 24, or trisomy 69.  This test utilizes a maternal blood sample and DNA sequencing technology to isolate circulating cell free fetal DNA from maternal plasma.  The fetal DNA can then be analyzed for DNA sequences that are derived from the three most common chromosomes involved in aneuploidy, chromosomes 13, 18, and 21.  If the overall amount of DNA is greater than the expected level for any of these chromosomes, aneuploidy is suspected.  While we do not consider it a replacement for invasive testing and karyotype analysis, a negative result from this testing would be reassuring, though not a guarantee of a normal chromosome complement for the baby.  An abnormal result is certainly suggestive of an abnormal chromosome complement, though we would still recommend CVS or amniocentesis to confirm any findings from this testing.  As we discussed when we reviewed these results, if more definitive information is desired, we would offer chorionic villus sampling or amniocentesis.  Because we do not yet know the effectiveness of combined first and second trimester screening, we do not recommend a maternal serum screen to assess the chance for chromosome conditions.  However, if screening for neural tube defects is desired, maternal serum screening for AFP only can be performed between 15 and [redacted] weeks gestation.    The patient elected to proceed with InformaSeq testing today.  Results should be available within 2 weeks.  The patient was encouraged to contact us with any questions.  We may be reached at [phone].   Pamela Anderson, Pamela Robertson, Pamela Robertson    11/20/2015 Addendum:  Records on Pamela Robertson. Latrelle Dodrill (the father of the baby's mother) were received from [facility] dating back to her initial evaluation there in 2006.  There is no mention of any genetic testing for NF1, only documentation of her clinical diagnosis, management and extensive family history.  If genetic testing were desired  for any family members in the future, then that could be ordered by a Medical Geneticist or likely the [facility] clinic where the family is followed. Because no mutation has been identified and Pamela Robertson. Pamela Robertson does not desired prenatal diagnosis for NF1, then assessment of the baby after delivery would be encouraged.

## 2015-10-12 ENCOUNTER — Other Ambulatory Visit: Payer: Self-pay

## 2015-10-12 DIAGNOSIS — Z369 Encounter for antenatal screening, unspecified: Secondary | ICD-10-CM

## 2015-10-16 ENCOUNTER — Ambulatory Visit
Admission: RE | Admit: 2015-10-16 | Discharge: 2015-10-16 | Disposition: A | Discharge status: Home / Self Care | Payer: Medicaid Other | Source: Ambulatory Visit | Attending: Maternal & Fetal Medicine | Admitting: Maternal & Fetal Medicine

## 2015-10-16 DIAGNOSIS — Z36 Encounter for antenatal screening of mother: Secondary | ICD-10-CM | POA: Insufficient documentation

## 2015-10-16 DIAGNOSIS — Z369 Encounter for antenatal screening, unspecified: Secondary | ICD-10-CM

## 2015-10-16 DIAGNOSIS — Z3A12 12 weeks gestation of pregnancy: Secondary | ICD-10-CM | POA: Insufficient documentation

## 2015-10-22 LAB — INFORMASEQ(SM) WITH XY ANALYSIS
Fetal Fraction (%):: 7.2
Fetal Number: 1
Gestational Age at Collection: 12.1 weeks
Weight: 228 [lb_av]

## 2015-10-23 ENCOUNTER — Telehealth: Payer: Self-pay | Admitting: Obstetrics and Gynecology

## 2015-10-23 NOTE — Telephone Encounter (Signed)
The patient was informed of the results of her recent InformaSeq testing (performed at [facility]) which yielded NEGATIVE results.  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  The sensitivity for trisomy 6121, trisomy 6418 and trisomy 3013 using this testing are reported as 99.1%, 98.3% and 98.1% respectively.  Thus, while the results of this testing are highly accurate, they are not considered diagnostic at this time.  Should more definitive information be desired, the patient may still consider amniocentesis.   As requested to know by the patient, sex chromosome analysis was included for this sample.  Results was consistent with a female fetus (XY). This is predicted with >97% accuracy.  A maternal serum AFP only should be considered if screening for neural tube defects is desired.  In addition, as documented in the ultrasound report previously, the patient had a low PAPP-A on first trimester screening which would increase the risk for preeclampsia, preterm delivery, low birth weight and fetal loss.  Therefore, we recommend additional ultrasound for growth in the third trimester.   We may be reached at [phone] with any questions or concerns.  Cherly Andersoneborah F. Dhani Imel, MS, CGC

## 2015-11-20 NOTE — Addendum Note (Signed)
Encounter addended by: Katrina Stackeborah Odalis Jordan on: 11/20/2015  4:59 PM<BR>     Documentation filed: Notes Section

## 2016-01-25 NOTE — Addendum Note (Signed)
Encounter addended by: Oran ReinSarahn Rossi Burdo, MD on: 01/25/2016  6:59 PM<BR>    Actions taken: Sign clinical note, Charge Capture section accepted

## 2016-01-25 NOTE — Progress Notes (Signed)
I have reviewed the above note and agree with the plan.  I was present and agree.  Pamela ForehandS. Kerolos Nehme, MD

## 2016-02-23 ENCOUNTER — Ambulatory Visit: Payer: Medicaid Other | Admitting: *Deleted

## 2016-02-26 ENCOUNTER — Ambulatory Visit: Payer: Medicaid Other | Admitting: *Deleted

## 2016-03-11 ENCOUNTER — Ambulatory Visit: Payer: Medicaid Other | Admitting: Dietician

## 2016-03-25 ENCOUNTER — Encounter: Payer: Self-pay | Admitting: *Deleted

## 2016-03-25 NOTE — Consult Note (Signed)
SEEN AND OK'ED BY DR Randa NgoPISCITELLO

## 2016-03-25 NOTE — Progress Notes (Signed)
Anesthesiology consult note ( Ambulatory referral to OB anesthesia for morbid obesity) :      I had the distinct pleasure of meeting Pamela Robertson today for an OB anesthesia precheck.  She has a Hx of morbid obesity with a BMI today of 47 which puts her at the borderline for BMI.  Patient reports no previous problems with anesthesia, no problems with her back and has a MP score of 2.  We discussed that in the next few weeks, she will need to watch her diet carefully and attempt some weight control with more nutritional choices along with water as the liquid of choice.  We discussed the possibility that if her weight balloons much above 50 BMI we may have to make arrangements for delivery at a major medical center.  The he weight could make her higher risk from an anesthesia standpoint.  Patient appears to understand.  Obviously, the [facility] will need [facility] and arrangements made if a marked change in BMI.  If we can stay at a BMI of 50 - 52, we can deliver here.

## 2016-04-02 LAB — OB RESULTS CONSOLE GBS: STREP GROUP B AG: NEGATIVE

## 2016-04-02 LAB — OB RESULTS CONSOLE GC/CHLAMYDIA
CHLAMYDIA, DNA PROBE: NEGATIVE
GC PROBE AMP, GENITAL: NEGATIVE

## 2016-04-28 ENCOUNTER — Inpatient Hospital Stay: Payer: Medicaid Other | Admitting: Anesthesiology

## 2016-04-28 ENCOUNTER — Inpatient Hospital Stay: Admission: RE | Admit: 2016-04-28 | Payer: Medicaid Other | Source: Ambulatory Visit

## 2016-04-28 ENCOUNTER — Encounter
Admission: EM | Disposition: A | Discharge status: Home / Self Care | Payer: Self-pay | Source: Home / Self Care | Attending: Obstetrics and Gynecology

## 2016-04-28 ENCOUNTER — Inpatient Hospital Stay
Admission: EM | Admit: 2016-04-28 | Discharge: 2016-04-30 | DRG: 765 | Disposition: A | Discharge status: Home / Self Care | Payer: Medicaid Other | Attending: Obstetrics and Gynecology | Admitting: Obstetrics and Gynecology

## 2016-04-28 DIAGNOSIS — O479 False labor, unspecified: Secondary | ICD-10-CM | POA: Diagnosis present

## 2016-04-28 DIAGNOSIS — O99344 Other mental disorders complicating childbirth: Secondary | ICD-10-CM | POA: Diagnosis present

## 2016-04-28 DIAGNOSIS — Z6841 Body Mass Index (BMI) 40.0 and over, adult: Secondary | ICD-10-CM | POA: Diagnosis not present

## 2016-04-28 DIAGNOSIS — O9081 Anemia of the puerperium: Secondary | ICD-10-CM | POA: Diagnosis not present

## 2016-04-28 DIAGNOSIS — F319 Bipolar disorder, unspecified: Secondary | ICD-10-CM | POA: Diagnosis present

## 2016-04-28 DIAGNOSIS — Z3490 Encounter for supervision of normal pregnancy, unspecified, unspecified trimester: Secondary | ICD-10-CM

## 2016-04-28 DIAGNOSIS — O4202 Full-term premature rupture of membranes, onset of labor within 24 hours of rupture: Secondary | ICD-10-CM | POA: Diagnosis present

## 2016-04-28 DIAGNOSIS — O99214 Obesity complicating childbirth: Secondary | ICD-10-CM | POA: Diagnosis present

## 2016-04-28 DIAGNOSIS — Z3A40 40 weeks gestation of pregnancy: Secondary | ICD-10-CM | POA: Diagnosis not present

## 2016-04-28 DIAGNOSIS — Z87891 Personal history of nicotine dependence: Secondary | ICD-10-CM | POA: Diagnosis not present

## 2016-04-28 DIAGNOSIS — D62 Acute posthemorrhagic anemia: Secondary | ICD-10-CM | POA: Diagnosis not present

## 2016-04-28 DIAGNOSIS — Z833 Family history of diabetes mellitus: Secondary | ICD-10-CM

## 2016-04-28 DIAGNOSIS — Z9889 Other specified postprocedural states: Secondary | ICD-10-CM

## 2016-04-28 LAB — CBC
HEMATOCRIT: 30 % — AB (ref 35.0–47.0)
Hemoglobin: 9.9 g/dL — ABNORMAL LOW (ref 12.0–16.0)
MCH: 26.1 pg (ref 26.0–34.0)
MCHC: 33 g/dL (ref 32.0–36.0)
MCV: 78.9 fL — ABNORMAL LOW (ref 80.0–100.0)
Platelets: 295 10*3/uL (ref 150–440)
RBC: 3.8 MIL/uL (ref 3.80–5.20)
RDW: 16.2 % — AB (ref 11.5–14.5)
WBC: 8.6 10*3/uL (ref 3.6–11.0)

## 2016-04-28 LAB — CHLAMYDIA/NGC RT PCR (ARMC ONLY)
CHLAMYDIA TR: NOT DETECTED
N GONORRHOEAE: NOT DETECTED

## 2016-04-28 LAB — COMPREHENSIVE METABOLIC PANEL
ALBUMIN: 3.1 g/dL — AB (ref 3.5–5.0)
ALT: 9 U/L — AB (ref 14–54)
AST: 23 U/L (ref 15–41)
Alkaline Phosphatase: 119 U/L (ref 38–126)
Anion gap: 7 (ref 5–15)
BUN: 12 mg/dL (ref 6–20)
CHLORIDE: 108 mmol/L (ref 101–111)
CO2: 21 mmol/L — AB (ref 22–32)
CREATININE: 0.64 mg/dL (ref 0.44–1.00)
Calcium: 8.8 mg/dL — ABNORMAL LOW (ref 8.9–10.3)
GFR calc Af Amer: >60 mL/min (ref >60)
GLUCOSE: 72 mg/dL (ref 65–99)
POTASSIUM: 3.6 mmol/L (ref 3.5–5.1)
Sodium: 136 mmol/L (ref 135–145)
Total Bilirubin: 0.8 mg/dL (ref 0.3–1.2)
Total Protein: 6.9 g/dL (ref 6.5–8.1)

## 2016-04-28 LAB — URINALYSIS COMPLETE WITH MICROSCOPIC (ARMC ONLY)
BACTERIA UA: NONE SEEN
Bilirubin Urine: NEGATIVE
Glucose, UA: NEGATIVE mg/dL
Hgb urine dipstick: NEGATIVE
Leukocytes, UA: NEGATIVE
Nitrite: NEGATIVE
PH: 6 (ref 5.0–8.0)
PROTEIN: 30 mg/dL — AB
Specific Gravity, Urine: 1.023 (ref 1.005–1.030)

## 2016-04-28 LAB — PROTEIN / CREATININE RATIO, URINE
CREATININE, URINE: 189 mg/dL
PROTEIN CREATININE RATIO: 0.29 mg/mg{creat} — AB (ref 0.00–0.15)
Total Protein, Urine: 54 mg/dL

## 2016-04-28 LAB — TYPE AND SCREEN
ABO/RH(D): O POS
ANTIBODY SCREEN: NEGATIVE

## 2016-04-28 SURGERY — Surgical Case
Wound class: Clean Contaminated

## 2016-04-28 MED ORDER — FENTANYL 2.5 MCG/ML W/ROPIVACAINE 0.2% IN NS 100 ML EPIDURAL INFUSION (ARMC-ANES)
EPIDURAL | Status: AC
  Filled 2016-04-28: qty 100

## 2016-04-28 MED ORDER — SOD CITRATE-CITRIC ACID 500-334 MG/5ML PO SOLN
30.0000 mL | ORAL | Status: DC | PRN
  Administered 2016-04-28: 30 mL via ORAL
  Filled 2016-04-28: qty 15

## 2016-04-28 MED ORDER — KETOROLAC TROMETHAMINE 30 MG/ML IJ SOLN
INTRAMUSCULAR | Status: AC
  Administered 2016-04-28: 30 mg via INTRAVENOUS
  Filled 2016-04-28: qty 1

## 2016-04-28 MED ORDER — FENTANYL 2.5 MCG/ML W/ROPIVACAINE 0.2% IN NS 100 ML EPIDURAL INFUSION (ARMC-ANES)
EPIDURAL | Status: DC | PRN
  Administered 2016-04-28: 10 mL/h via EPIDURAL
  Administered 2016-04-28: 250 ug via EPIDURAL

## 2016-04-28 MED ORDER — HYDROMORPHONE HCL 1 MG/ML IJ SOLN
INTRAMUSCULAR | Status: DC | PRN
  Administered 2016-04-28: 0.5 mg via INTRAVENOUS

## 2016-04-28 MED ORDER — NALBUPHINE HCL 10 MG/ML IJ SOLN: 5.0000 mg | INTRAMUSCULAR | Status: DC | PRN

## 2016-04-28 MED ORDER — CEFAZOLIN SODIUM 10 G IJ SOLR
3.0000 g | Freq: Once | INTRAMUSCULAR | Status: AC
  Administered 2016-04-28: 3 g via INTRAVENOUS
  Filled 2016-04-28: qty 3000

## 2016-04-28 MED ORDER — OXYTOCIN 40 UNITS IN LACTATED RINGERS INFUSION - SIMPLE MED
INTRAVENOUS | Status: AC
  Filled 2016-04-28: qty 1000

## 2016-04-28 MED ORDER — LIDOCAINE 2% (20 MG/ML) 5 ML SYRINGE
INTRAMUSCULAR | Status: DC | PRN
  Administered 2016-04-28 (×3): 100 mg via INTRAVENOUS

## 2016-04-28 MED ORDER — ACETAMINOPHEN 325 MG PO TABS: 650.0000 mg | ORAL_TABLET | ORAL | Status: DC | PRN

## 2016-04-28 MED ORDER — OXYTOCIN 40 UNITS IN LACTATED RINGERS INFUSION - SIMPLE MED: 2.5000 [IU]/h | INTRAVENOUS | Status: DC

## 2016-04-28 MED ORDER — MISOPROSTOL 200 MCG PO TABS
ORAL_TABLET | ORAL | Status: AC
  Filled 2016-04-28: qty 4

## 2016-04-28 MED ORDER — DIPHENHYDRAMINE HCL 50 MG/ML IJ SOLN: 12.5000 mg | INTRAMUSCULAR | Status: DC | PRN

## 2016-04-28 MED ORDER — AMMONIA AROMATIC IN INHA
RESPIRATORY_TRACT | Status: AC
  Filled 2016-04-28: qty 10

## 2016-04-28 MED ORDER — EPHEDRINE 5 MG/ML INJ
INTRAVENOUS | Status: AC
  Administered 2016-04-28: 5 mg via INTRAVENOUS
  Filled 2016-04-28: qty 10

## 2016-04-28 MED ORDER — LIDOCAINE-EPINEPHRINE (PF) 1.5 %-1:200000 IJ SOLN
INTRAMUSCULAR | Status: DC | PRN
  Administered 2016-04-28: 3 mL

## 2016-04-28 MED ORDER — ONDANSETRON HCL 4 MG/2ML IJ SOLN: 4.0000 mg | Freq: Four times a day (QID) | INTRAMUSCULAR | Status: DC | PRN

## 2016-04-28 MED ORDER — LACTATED RINGERS IV SOLN: 500.0000 mL | INTRAVENOUS | Status: DC | PRN

## 2016-04-28 MED ORDER — LIDOCAINE HCL (PF) 1 % IJ SOLN
INTRAMUSCULAR | Status: AC
  Filled 2016-04-28: qty 30

## 2016-04-28 MED ORDER — LACTATED RINGERS IV SOLN
INTRAVENOUS | Status: DC
Start: 2016-04-28 — End: 2016-04-28
  Administered 2016-04-28 (×3): via INTRAVENOUS

## 2016-04-28 MED ORDER — TERBUTALINE SULFATE 1 MG/ML IJ SOLN
0.2500 mg | Freq: Once | INTRAMUSCULAR | Status: AC | PRN
  Administered 2016-04-28: 0.25 mg via SUBCUTANEOUS
  Filled 2016-04-28: qty 1

## 2016-04-28 MED ORDER — NALBUPHINE HCL 10 MG/ML IJ SOLN: 5.0000 mg | Freq: Once | INTRAMUSCULAR | Status: DC | PRN

## 2016-04-28 MED ORDER — SOD CITRATE-CITRIC ACID 500-334 MG/5ML PO SOLN: 30.0000 mL | ORAL | Status: DC | PRN

## 2016-04-28 MED ORDER — KETOROLAC TROMETHAMINE 30 MG/ML IJ SOLN: 30.0000 mg | Freq: Four times a day (QID) | INTRAMUSCULAR | Status: DC | PRN

## 2016-04-28 MED ORDER — LACTATED RINGERS IV SOLN: INTRAVENOUS | Status: DC

## 2016-04-28 MED ORDER — OXYTOCIN 40 UNITS IN LACTATED RINGERS INFUSION - SIMPLE MED
1.0000 m[IU]/min | INTRAVENOUS | Status: DC
  Administered 2016-04-28: 1 m[IU]/min via INTRAVENOUS
  Administered 2016-04-28: 500 mL via INTRAVENOUS

## 2016-04-28 MED ORDER — EPHEDRINE 5 MG/ML INJ
5.0000 mg | Freq: Once | INTRAVENOUS | Status: AC
Start: 2016-04-28 — End: 2016-04-28
  Administered 2016-04-28: 5 mg via INTRAVENOUS

## 2016-04-28 MED ORDER — HYDROMORPHONE HCL 1 MG/ML IJ SOLN
INTRAMUSCULAR | Status: AC
  Filled 2016-04-28: qty 1

## 2016-04-28 MED ORDER — BUTORPHANOL TARTRATE 1 MG/ML IJ SOLN: 1.0000 mg | INTRAMUSCULAR | Status: DC | PRN

## 2016-04-28 MED ORDER — KETOROLAC TROMETHAMINE 30 MG/ML IJ SOLN
30.0000 mg | Freq: Four times a day (QID) | INTRAMUSCULAR | Status: DC | PRN
  Administered 2016-04-28: 30 mg via INTRAVENOUS
  Filled 2016-04-28: qty 1

## 2016-04-28 MED ORDER — FENTANYL CITRATE (PF) 100 MCG/2ML IJ SOLN
25.0000 ug | INTRAMUSCULAR | Status: DC | PRN
  Administered 2016-04-28: 50 ug via INTRAVENOUS

## 2016-04-28 MED ORDER — ONDANSETRON HCL 4 MG/2ML IJ SOLN: 4.0000 mg | Freq: Three times a day (TID) | INTRAMUSCULAR | Status: DC | PRN

## 2016-04-28 MED ORDER — OXYCODONE HCL 5 MG PO TABS: 5.0000 mg | ORAL_TABLET | Freq: Four times a day (QID) | ORAL | Status: DC | PRN

## 2016-04-28 MED ORDER — DEXTROSE 5 % IV SOLN
500.0000 mg | INTRAVENOUS | Status: DC
  Administered 2016-04-28: 500 mg via INTRAVENOUS
  Filled 2016-04-28 (×2): qty 500

## 2016-04-28 MED ORDER — SODIUM CHLORIDE 0.9% FLUSH: 3.0000 mL | INTRAVENOUS | Status: DC | PRN

## 2016-04-28 MED ORDER — OXYTOCIN BOLUS FROM INFUSION: 500.0000 mL | Freq: Once | INTRAVENOUS | Status: DC

## 2016-04-28 MED ORDER — FENTANYL CITRATE (PF) 100 MCG/2ML IJ SOLN
INTRAMUSCULAR | Status: AC
  Administered 2016-04-28: 50 ug via INTRAVENOUS
  Filled 2016-04-28: qty 2

## 2016-04-28 MED ORDER — SODIUM CHLORIDE FLUSH 0.9 % IV SOLN
INTRAVENOUS | Status: AC
  Filled 2016-04-28: qty 10

## 2016-04-28 MED ORDER — NALOXONE HCL 2 MG/2ML IJ SOSY
1.0000 ug/kg/h | PREFILLED_SYRINGE | INTRAVENOUS | Status: DC | PRN
  Filled 2016-04-28: qty 2

## 2016-04-28 MED ORDER — LIDOCAINE HCL (PF) 1 % IJ SOLN: 30.0000 mL | INTRAMUSCULAR | Status: DC | PRN

## 2016-04-28 MED ORDER — NALOXONE HCL 0.4 MG/ML IJ SOLN: 0.4000 mg | INTRAMUSCULAR | Status: DC | PRN

## 2016-04-28 MED ORDER — MEPERIDINE HCL 25 MG/ML IJ SOLN: 6.2500 mg | INTRAMUSCULAR | Status: DC | PRN

## 2016-04-28 MED ORDER — ONDANSETRON HCL 4 MG/2ML IJ SOLN: 4.0000 mg | Freq: Once | INTRAMUSCULAR | Status: DC | PRN

## 2016-04-28 MED ORDER — BUPIVACAINE HCL (PF) 0.25 % IJ SOLN
INTRAMUSCULAR | Status: DC | PRN
  Administered 2016-04-28 (×3): 5 mL via EPIDURAL

## 2016-04-28 MED ORDER — EPHEDRINE 5 MG/ML INJ
10.0000 mg | Freq: Once | INTRAVENOUS | Status: AC
  Administered 2016-04-28: 10 mg via INTRAVENOUS

## 2016-04-28 MED ORDER — DIPHENHYDRAMINE HCL 25 MG PO CAPS: 25.0000 mg | ORAL_CAPSULE | ORAL | Status: DC | PRN

## 2016-04-28 MED ORDER — OXYTOCIN 40 UNITS IN LACTATED RINGERS INFUSION - SIMPLE MED
2.5000 [IU]/h | INTRAVENOUS | Status: DC
  Filled 2016-04-28 (×2): qty 1000

## 2016-04-28 MED ORDER — OXYTOCIN 10 UNIT/ML IJ SOLN
INTRAMUSCULAR | Status: AC
  Filled 2016-04-28: qty 2

## 2016-04-28 SURGICAL SUPPLY — 25 items
BARRIER ADHS 3X4 INTERCEED (GAUZE/BANDAGES/DRESSINGS) ×3 IMPLANT
CANISTER SUCT 3000ML (MISCELLANEOUS) ×3 IMPLANT
CATH KIT ON-Q SILVERSOAK 5IN (CATHETERS) IMPLANT
CHLORAPREP W/TINT 26ML (MISCELLANEOUS) ×3 IMPLANT
DRSG TELFA 3X8 NADH (GAUZE/BANDAGES/DRESSINGS) ×3 IMPLANT
ELECT CAUTERY BLADE 6.4 (BLADE) ×3 IMPLANT
ELECT REM PT RETURN 9FT ADLT (ELECTROSURGICAL) ×3
ELECTRODE REM PT RTRN 9FT ADLT (ELECTROSURGICAL) ×1 IMPLANT
GAUZE SPONGE 4X4 12PLY STRL (GAUZE/BANDAGES/DRESSINGS) ×3 IMPLANT
GLOVE BIO SURGEON STRL SZ8 (GLOVE) ×3 IMPLANT
GOWN STRL REUS W/ TWL LRG LVL3 (GOWN DISPOSABLE) ×2 IMPLANT
GOWN STRL REUS W/ TWL XL LVL3 (GOWN DISPOSABLE) ×1 IMPLANT
GOWN STRL REUS W/TWL LRG LVL3 (GOWN DISPOSABLE) ×4
GOWN STRL REUS W/TWL XL LVL3 (GOWN DISPOSABLE) ×2
NS IRRIG 1000ML POUR BTL (IV SOLUTION) ×3 IMPLANT
PACK C SECTION AR (MISCELLANEOUS) ×3 IMPLANT
PAD OB MATERNITY 4.3X12.25 (PERSONAL CARE ITEMS) ×3 IMPLANT
PAD PREP 24X41 OB/GYN DISP (PERSONAL CARE ITEMS) ×3 IMPLANT
SPONGE LAP 18X18 5 PK (GAUZE/BANDAGES/DRESSINGS) ×3 IMPLANT
STAPLER INSORB 30 2030 C-SECTI (MISCELLANEOUS) ×3 IMPLANT
STRAP SAFETY BODY (MISCELLANEOUS) ×3 IMPLANT
SUT CHROMIC 1 CTX 36 (SUTURE) ×9 IMPLANT
SUT CHROMIC 2 0 CT 1 (SUTURE) ×3 IMPLANT
SUT PLAIN GUT 0 (SUTURE) IMPLANT
SUT VIC AB 0 CT1 36 (SUTURE) ×6 IMPLANT

## 2016-04-28 NOTE — Anesthesia Procedure Notes (Signed)
Procedures

## 2016-04-28 NOTE — Progress Notes (Signed)
Pamela Robertson is a 25 y.o. G1P0 at 4112w0d by Subjective:  Pit at 7 mu/ min .   Objective: BP 138/78   Pulse 83   Temp 98.4 F (36.9 C) (Oral)   Resp 20   Ht 5\' 1"  (1.549 m)   Wt 263 lb (119.3 kg)   LMP 06/30/2015   BMI 49.69 kg/m  No intake/output data recorded. Total I/O In: 236 [P.O.:236] Out: 1525 [Urine:1525]  FHT:  FHR: 150 bpm, variability: moderate,  accelerations:  Present,  decelerations:  Absent UC:   regular, every 3 minutes, adequate MVU SVE:   Dilation: 5.5 Effacement (%): 100 Station: -1 Exam by:: Peola Joynt  Labs: Lab Results  Component Value Date   WBC 8.6 04/28/2016   HGB 9.9 (L) 04/28/2016   HCT 30.0 (L) 04/28/2016   MCV 78.9 (L) 04/28/2016   PLT 295 04/28/2016    Assessment / Plan: Active phase arrest  Primary LTCS  zithromax 500 mg and 3 gm Ancef Risk explained to the pt . All questions answered   Braxton Weisbecker 04/28/2016, 6:20 PM

## 2016-04-28 NOTE — Brief Op Note (Addendum)
04/28/2016  8:27 PM  PATIENT:  Pamela Robertson  25 y.o. female  PRE-OPERATIVE DIAGNOSIS:  active phase arrest  POST-OPERATIVE DIAGNOSIS:  same  PROCEDURE:  Procedure(s): CESAREAN SECTION ltcs SURGEON:  Surgeon(s) and Role:    Suzy Bouchard* Jaydee Conran J Odysseus Cada, MD - Primary  PHYSICIAN ASSISTANT:   ASSISTANTS: scrub tech    ANESTHESIA:   none  EBL:  Total I/O In: 700 [I.V.:700] Out: 550 [Urine:50; Blood:500]  BLOOD ADMINISTERED:none  DRAINS: Urinary Catheter (Foley)   LOCAL MEDICATIONS USED:  NONE  SPECIMEN:  No Specimen  DISPOSITION OF SPECIMEN:  N/A  COUNTS:  YES  TOURNIQUET:  * No tourniquets in log *  DICTATION: .Other Dictation: Dictation Number verbal   PLAN OF CARE: Admit to inpatient   PATIENT DISPOSITION:  PACU - hemodynamically stable.   Delay start of Pharmacological VTE agent (>24hrs) due to surgical blood loss or risk of bleeding: not applicable

## 2016-04-28 NOTE — Anesthesia Procedure Notes (Signed)
Epidural Patient location during procedure: OB Start time: 04/28/2016 6:37 AM End time: 04/28/2016 7:00 AM  Staffing Anesthesiologist: Priscella MannPENWARDEN, Burak Zerbe Performed: anesthesiologist   Preanesthetic Checklist Completed: patient identified, site marked, surgical consent, pre-op evaluation, timeout performed, IV checked, risks and benefits discussed and monitors and equipment checked  Epidural Patient position: sitting Prep: ChloraPrep Patient monitoring: heart rate, continuous pulse ox and blood pressure Approach: midline Location: L4-L5 Injection technique: LOR saline  Needle:  Needle type: Tuohy  Needle gauge: 18 G Needle length: 9 cm and 9 Needle insertion depth: 9.5 cm Catheter type: closed end flexible Catheter size: 20 Guage Test dose: negative (0.125% bupivacaine)  Assessment Events: blood not aspirated, injection not painful, no injection resistance, negative IV test and no paresthesia  Additional Notes   Patient tolerated the insertion well without complications.Reason for block:procedure for pain

## 2016-04-28 NOTE — OB Triage Note (Signed)
[facility] with c/o of SROM at 0300 and contractions. NitraTest preformed and confirmed ROM. Pt moved to LDR and admitted.

## 2016-04-28 NOTE — Anesthesia Procedure Notes (Deleted)
Epidural Patient location during procedure: OB Start time: 04/28/2016 6:37 AM End time: 04/28/2016 7:00 AM  Staffing Anesthesiologist: Priscella MannPENWARDEN, Anabela Crayton Performed: anesthesiologist   Preanesthetic Checklist Completed: patient identified, site marked, surgical consent, pre-op evaluation, timeout performed, IV checked, risks and benefits discussed and monitors and equipment checked  Epidural Patient position: sitting Prep: ChloraPrep Patient monitoring: heart rate, continuous pulse ox and blood pressure Approach: midline Location: L4-L5 Injection technique: LOR saline  Needle:  Needle type: Tuohy  Needle gauge: 18 G Needle length: 9 cm and 9 Needle insertion depth: 9.5 cm Catheter type: closed end flexible Catheter size: 20 Guage Test dose: negative and 1.5% lidocaine with Epi 1:200 K  Assessment Events: blood not aspirated, injection not painful, no injection resistance, negative IV test and no paresthesia  Additional Notes   Patient tolerated the insertion well without complications.Reason for block:procedure for pain

## 2016-04-28 NOTE — Transfer of Care (Signed)
Immediate Anesthesia Transfer of Care Note  Patient: Pamela Robertson  Procedure(s) Performed: Procedure(s): CESAREAN SECTION  Patient Location: PACU  Anesthesia Type:Epidural  Level of Consciousness: awake, alert  and oriented  Airway & Oxygen Therapy: Patient Spontanous Breathing and Patient connected to face mask oxygen  Post-op Assessment: Report given to RN and Post -op Vital signs reviewed and stable  Post vital signs: Reviewed and stable  Last Vitals:  Vitals:   04/28/16 1754 04/28/16 2026  BP: 138/78 (!) 137/94  Pulse: 83   Resp:  16  Temp:  36.7 C    Complications: No apparent anesthesia complications

## 2016-04-28 NOTE — Anesthesia Procedure Notes (Signed)
Date/Time: 04/28/2016 7:25 AM Performed by: Stormy FabianURTIS, Valmore Arabie Pre-anesthesia Checklist: Patient identified, Emergency Drugs available, Suction available and Patient being monitored Patient Re-evaluated:Patient Re-evaluated prior to inductionOxygen Delivery Method: Nasal cannula Intubation Type: IV induction Dental Injury: Teeth and Oropharynx as per pre-operative assessment  Comments: Nasal cannula with etCO2 monitoring

## 2016-04-28 NOTE — Progress Notes (Signed)
Patient ID: Pamela Robertson, female   DOB: 12/31/1990, 25 y.o.   MRN: [id number] 2 late decels  S/p CLE  BP 95/67 O2 placed , FSE  A: blate decel , relative hypotension  P: 5 mg ephedrine iv

## 2016-04-28 NOTE — Anesthesia Preprocedure Evaluation (Addendum)
Anesthesia Evaluation  Patient identified by MRN, date of birth, ID band Patient awake    Reviewed: Allergy & Precautions, NPO status , Patient's Chart, lab work & pertinent test results  History of Anesthesia Complications Negative for: history of anesthetic complications  Airway Mallampati: III  TM Distance: >3 FB Neck ROM: Full    Dental no notable dental hx.    Pulmonary neg sleep apnea, neg COPD, former smoker,    breath sounds clear to auscultation- rhonchi (-) wheezing      Cardiovascular Exercise Tolerance: Good (-) hypertension(-) CAD and (-) Past MI  Rhythm:Regular Rate:Normal - Systolic murmurs and - Diastolic murmurs    Neuro/Psych Anxiety Depression Bipolar Disorder negative neurological ROS     GI/Hepatic negative GI ROS, Neg liver ROS,   Endo/Other  neg diabetesMorbid obesity  Renal/GU negative Renal ROS     Musculoskeletal   Abdominal (+) + obese,   Peds  Hematology   Anesthesia Other Findings Past Medical History: No date: Bipolar 1 disorder, depressed, full remission *     Comment: controlled with lamictal and prozac 2010: Depression     Comment[facility]  No date: Menstrual irregularity     Comment: occurring every 2 or 3 months   Reproductive/Obstetrics (+) Pregnancy                             Anesthesia Physical Anesthesia Plan  ASA: II  Anesthesia Plan: Epidural   Post-op Pain Management:    Induction:   Airway Management Planned:   Additional Equipment:   Intra-op Plan:   Post-operative Plan:   Informed Consent: I have reviewed the patients History and Physical, chart, labs and discussed the procedure including the risks, benefits and alternatives for the proposed anesthesia with the patient or authorized representative who has indicated his/her understanding and acceptance.     Plan Discussed with:  Anesthesiologist  Anesthesia Plan Comments:         Lab Results  Component Value Date   WBC 8.6 04/28/2016   HGB 9.9 (L) 04/28/2016   HCT 30.0 (L) 04/28/2016   MCV 78.9 (L) 04/28/2016   PLT 295 04/28/2016    Anesthesia Quick Evaluation

## 2016-04-28 NOTE — H&P (Signed)
Pamela Robertson is a 25 y.o. female presenting for active labor with SROM at 0300 this am . Pregnancy complicated by Low PAPP-A ,  FOB with neurofibromatosis  Type 1, no fetal testing . + downs screen with negative informaseq. GBS negative .+ Obesity  OB History    Gravida Para Term Preterm AB Living   1             SAB TAB Ectopic Multiple Live Births                 Past Medical History:  Diagnosis Date  . Bipolar 1 disorder, depressed, full remission (HCC)    controlled with lamictal and prozac  . Depression 2010   [facility]   . Menstrual irregularity    occurring every 2 or 3 months   Past Surgical History:  Procedure Laterality Date  . NO PAST SURGERIES     Family History: family history includes Diabetes in her father. Social History:  reports that she quit smoking about 4 years ago. Her smoking use included Cigarettes. She smoked 0.10 packs per day. She has never used smokeless tobacco. She reports that she does not drink alcohol or use drugs.     Maternal Diabetes: No Genetic Screening: Normal( + down screen risk with negative informaseq) Maternal Ultrasounds/Referrals: Normal Fetal Ultrasounds or other Referrals:  None Maternal Substance Abuse:  No Significant Maternal Medications:  None Significant Maternal Lab Results:  None,  Other Comments:  None  ROS History Dilation: 4.5 Effacement (%): 100 Station: -1 Exam by:: JCM Blood pressure (!) 139/93, pulse 81, temperature 98 F (36.7 C), temperature source Oral, resp. rate 18, height 5\' 1"  (1.549 m), weight 263 lb (119.3 kg), last menstrual period 06/30/2015. Exam TJS cx 4 cm / c/ 0 / vtx Physical Exam  Lungs CTA  CV RRR  adb gravid . EFM : reassuring + accels ,good variability  . Poor ctx pattern  Prenatal labs: ABO, Rh: --/--/O POS (11/26 16100438) Antibody: NEG (11/26 0438) Rubella:  imm RPR:   neg HBsAg:   neg HIV:   neg GBS:   neg  Assessment/Plan: Active labor   Reassuring fetal monitor, Elevated BP - check PIH labs  Notify peds of FHX of neurofibromatosis Pitocin augment    Darionna Banke 04/28/2016, 7:08 AM

## 2016-04-29 DIAGNOSIS — Z9889 Other specified postprocedural states: Secondary | ICD-10-CM

## 2016-04-29 LAB — CBC
HCT: 26.9 % — ABNORMAL LOW (ref 35.0–47.0)
Hemoglobin: 8.8 g/dL — ABNORMAL LOW (ref 12.0–16.0)
MCH: 25.1 pg — ABNORMAL LOW (ref 26.0–34.0)
MCHC: 32.5 g/dL (ref 32.0–36.0)
MCV: 77.2 fL — ABNORMAL LOW (ref 80.0–100.0)
PLATELETS: 240 10*3/uL (ref 150–440)
RBC: 3.49 MIL/uL — AB (ref 3.80–5.20)
RDW: 16.1 % — AB (ref 11.5–14.5)
WBC: 13.1 10*3/uL — AB (ref 3.6–11.0)

## 2016-04-29 LAB — RPR: RPR Ser Ql: NONREACTIVE

## 2016-04-29 MED ORDER — OXYCODONE-ACETAMINOPHEN 5-325 MG PO TABS: 2.0000 | ORAL_TABLET | ORAL | Status: DC | PRN

## 2016-04-29 MED ORDER — PRENATAL MULTIVITAMIN CH
1.0000 | ORAL_TABLET | Freq: Every day | ORAL | Status: DC
  Administered 2016-04-29 – 2016-04-30 (×2): 1 via ORAL
  Filled 2016-04-29 (×2): qty 1

## 2016-04-29 MED ORDER — TETANUS-DIPHTH-ACELL PERTUSSIS 5-2.5-18.5 LF-MCG/0.5 IM SUSP: 0.5000 mL | Freq: Once | INTRAMUSCULAR | Status: DC

## 2016-04-29 MED ORDER — COCONUT OIL OIL: 1.0000 "application " | TOPICAL_OIL | Status: DC | PRN

## 2016-04-29 MED ORDER — LACTATED RINGERS IV SOLN
INTRAVENOUS | Status: DC
  Administered 2016-04-29 (×2): via INTRAVENOUS

## 2016-04-29 MED ORDER — IBUPROFEN 600 MG PO TABS
600.0000 mg | ORAL_TABLET | Freq: Four times a day (QID) | ORAL | Status: DC
  Administered 2016-04-29: 600 mg via ORAL
  Filled 2016-04-29: qty 1

## 2016-04-29 MED ORDER — ZOLPIDEM TARTRATE 5 MG PO TABS
5.0000 mg | ORAL_TABLET | Freq: Every evening | ORAL | Status: DC | PRN
Start: 2016-04-29 — End: 2016-04-30

## 2016-04-29 MED ORDER — WITCH HAZEL-GLYCERIN EX PADS: 1.0000 "application " | MEDICATED_PAD | CUTANEOUS | Status: DC | PRN

## 2016-04-29 MED ORDER — SIMETHICONE 80 MG PO CHEW
80.0000 mg | CHEWABLE_TABLET | ORAL | Status: DC
  Administered 2016-04-29: 80 mg via ORAL
  Filled 2016-04-29: qty 1

## 2016-04-29 MED ORDER — DIBUCAINE 1 % RE OINT: 1.0000 "application " | TOPICAL_OINTMENT | RECTAL | Status: DC | PRN

## 2016-04-29 MED ORDER — SENNOSIDES-DOCUSATE SODIUM 8.6-50 MG PO TABS
2.0000 | ORAL_TABLET | ORAL | Status: DC
  Administered 2016-04-29: 2 via ORAL
  Filled 2016-04-29: qty 2

## 2016-04-29 MED ORDER — SIMETHICONE 80 MG PO CHEW: 80.0000 mg | CHEWABLE_TABLET | ORAL | Status: DC | PRN

## 2016-04-29 MED ORDER — DIPHENHYDRAMINE HCL 25 MG PO CAPS: 25.0000 mg | ORAL_CAPSULE | Freq: Four times a day (QID) | ORAL | Status: DC | PRN

## 2016-04-29 MED ORDER — OXYCODONE-ACETAMINOPHEN 5-325 MG PO TABS
1.0000 | ORAL_TABLET | ORAL | Status: DC | PRN
  Administered 2016-04-29 – 2016-04-30 (×5): 1 via ORAL
  Filled 2016-04-29 (×5): qty 1

## 2016-04-29 MED ORDER — FERROUS SULFATE 325 (65 FE) MG PO TABS
325.0000 mg | ORAL_TABLET | Freq: Two times a day (BID) | ORAL | Status: DC
  Administered 2016-04-29 – 2016-04-30 (×3): 325 mg via ORAL
  Filled 2016-04-29 (×3): qty 1

## 2016-04-29 MED ORDER — MENTHOL 3 MG MT LOZG
1.0000 | LOZENGE | OROMUCOSAL | Status: DC | PRN
  Filled 2016-04-29: qty 9

## 2016-04-29 MED ORDER — OXYTOCIN 40 UNITS IN LACTATED RINGERS INFUSION - SIMPLE MED: 2.5000 [IU]/h | INTRAVENOUS | Status: DC

## 2016-04-29 MED ORDER — SIMETHICONE 80 MG PO CHEW
80.0000 mg | CHEWABLE_TABLET | Freq: Three times a day (TID) | ORAL | Status: DC
Start: 2016-04-29 — End: 2016-04-30
  Administered 2016-04-29 – 2016-04-30 (×5): 80 mg via ORAL
  Filled 2016-04-29 (×5): qty 1

## 2016-04-29 MED ORDER — ACETAMINOPHEN 325 MG PO TABS: 650.0000 mg | ORAL_TABLET | ORAL | Status: DC | PRN

## 2016-04-29 MED ORDER — GUAIFENESIN ER 600 MG PO TB12: 600.0000 mg | ORAL_TABLET | Freq: Two times a day (BID) | ORAL | Status: DC | PRN

## 2016-04-29 NOTE — Progress Notes (Signed)
POSTOPERATIVE DAY # 1 S/P LTCS   S:         Reports feeling good, but sore when she coughs - states she has had a productive cough with yellow sputum  Lactation working with her on breastfeeding             Tolerating po intake / no nausea / no vomiting / + flatus / no BM             Bleeding is light - no clots             Pain controlled with IV pain medication             Foley catheter still in place due to amber colored urine  Newborn breast feeding  - lactation assisting now, with reassessment, patient states she is going to formula feed   O:  VS: BP 129/80 (BP Location: Left Arm)   Pulse 90   Temp 98.2 F (36.8 C) (Oral)   Resp 18   Ht 5\' 1"  (1.549 m)   Wt 119.3 kg (263 lb)   LMP 06/30/2015   SpO2 100%   Breastfeeding? Unknown   BMI 49.69 kg/m    LABS:               Recent Labs  04/28/16 0438 04/29/16 0539  WBC 8.6 13.1*  HGB 9.9* 8.8*  PLT 295 240               Bloodtype: --/--/O POS (11/26 0438)  Rubella: Immune (04/26 0000)                                             I&O: Intake/Output      11/26 0701 - 11/27 0700 11/27 0701 - 11/28 0700   P.O. 356    I.V. (mL/kg) 1598 (13.4)    Total Intake(mL/kg) 1954 (16.4)    Urine (mL/kg/hr) 2275 (0.8)    Blood 500 (0.2)    Total Output 2775     Net -821                       Physical Exam:             Alert and Oriented X3  Lungs: Clear and unlabored  Heart: regular rate and rhythm / no mumurs  Abdomen: soft, non-tender, non-distended , active bowel sounds in all 4 quadrants             Fundus: firm, non-tender, U-1             Dressing: pressure dressing saturated and removed by me - replaced with small Telfa and paper tape             Incision:  approximated with what appears to be Insorb staples (op note not available for verification) / no erythema / no ecchymosis / no active drainage  Perineum: intact  Lochia: small, no clots, appropriate  Extremities: mild bilateral lower extremity edema, no calf pain  or tenderness, SCDs in place  A:        POD # 1 S/P Primary LTCS for active phase arrest            ABL anemia compounding IDA   P:        Routine postoperative care  D/C Foley this afternoon if adequate urine output >30cc/hr  Ambulate with assistance today  Transition to oral pain medication today   Oral FE BID today  Continue current care  Carlean JewsMeredith Naida Escalante, CNM

## 2016-04-29 NOTE — Progress Notes (Signed)
Pt in room with three family members.  Pt alert and in good spirits. CH is available.   04/29/16 1030  Clinical Encounter Type  Visited With Patient and family together  Visit Type Initial  Referral From Nurse  Spiritual Encounters  Spiritual Needs Emotional  Stress Factors  Patient Stress Factors None identified  Family Stress Factors None identified

## 2016-04-29 NOTE — Anesthesia Post-op Follow-up Note (Signed)
  Anesthesia Pain Follow-up Note  Patient: Pamela Robertson  Day #: 1  Date of Follow-up: 04/29/2016 Time: 7:48 AM  Last Vitals:  Vitals:   04/29/16 0200 04/29/16 0546  BP: 133/77 129/84  Pulse: 80 96  Resp: 18 18  Temp:  36.9 C    Level of Consciousness: alert  Pain: none   Side Effects:None  Catheter Site Exam:clean, dry, no drainage     Plan: D/C from anesthesia care  Starling Mannsurtis,  Earvin Blazier A

## 2016-04-29 NOTE — Anesthesia Postprocedure Evaluation (Signed)
Anesthesia Post Note  Patient: Pamela Robertson  Procedure(s) Performed: Procedure(s): CESAREAN SECTION  Patient location during evaluation: Mother Baby Anesthesia Type: Epidural Level of consciousness: awake and alert Pain management: pain level controlled Vital Signs Assessment: post-procedure vital signs reviewed and stable Respiratory status: spontaneous breathing, nonlabored ventilation and respiratory function stable Cardiovascular status: stable Postop Assessment: no headache, no backache and epidural receding Anesthetic complications: no    Last Vitals:  Vitals:   04/29/16 0200 04/29/16 0546  BP: 133/77 129/84  Pulse: 80 96  Resp: 18 18  Temp:  36.9 C    Last Pain:  Vitals:   04/29/16 0546  TempSrc: Oral  PainSc: 0-No pain                 Starling Mannsurtis,  Dannie Hattabaugh A

## 2016-04-30 MED ORDER — IBUPROFEN 600 MG PO TABS
600.0000 mg | ORAL_TABLET | Freq: Four times a day (QID) | ORAL | Status: DC
  Administered 2016-04-30 (×2): 600 mg via ORAL
  Filled 2016-04-30 (×2): qty 1

## 2016-04-30 MED ORDER — OXYCODONE-ACETAMINOPHEN 5-325 MG PO TABS: 1.0000 | ORAL_TABLET | ORAL | 0 refills | Status: DC | PRN

## 2016-04-30 MED ORDER — ACETAMINOPHEN 325 MG PO TABS: 650.0000 mg | ORAL_TABLET | ORAL | Status: DC | PRN

## 2016-04-30 MED ORDER — IBUPROFEN 600 MG PO TABS: 600.0000 mg | ORAL_TABLET | Freq: Four times a day (QID) | ORAL | 3 refills | Status: DC

## 2016-04-30 NOTE — Op Note (Signed)
NAME:  Pamela Robertson, Pamela Robertson           ACCOUNT NO.:  [id number]  MEDICAL RECORD NO.:  [id number]  LOCATION:  LD                           FACILITY:  [facility]  PHYSICIAN:  Jennell Cornerhomas Schermerhorn, MDDATE OF BIRTH:  [birth year]  DATE OF PROCEDURE:  04/28/2016 DATE OF DISCHARGE:                              OPERATIVE REPORT   PREOPERATIVE DIAGNOSIS:  Active phase arrest.  POSTOPERATIVE DIAGNOSIS:  Active phase arrest.  PROCEDURE PERFORMED:  Low-transverse cesarean section.  SURGEON:  Jennell Cornerhomas Schermerhorn, MD  ANESTHESIA:  Surgical dosing of continuous lumbar epidural.  SURGEON:  Jennell Cornerhomas Schermerhorn, MD  FIRST ASSISTANT:  Scrub tech.  INDICATIONS:  A 25 year old gravida 1, para 0, patient admitted to Labor and Delivery with spontaneous rupture of membranes.  Patient progressed only from 4.5 cm to 5.5 cm over 11 hours during the day, despite Pitocin administration and adequate amount of the daily units.  Much molding was identified during pelvic exam.  DESCRIPTION OF PROCEDURE:  After adequate surgical dosing of continuous lumbar epidural, patient was prepped and draped in normal sterile fashion.  She did receive 500 mg Zithromax and 3 g of Ancef given the patient's weight and her laboring status.  Time-out was performed.  A Pfannenstiel incision was then made 2 fingerbreadths above the symphysis pubis.  Sharp dissection was used to identify the fascia.  Fascia was opened in the midline and opened in a transverse fashion.  The superior aspect of the fascia was grasped with Kocher clamps and the recti muscles were dissected free.  Inferior aspect of the fascia was grasped with Kocher clamps.  Pyramidalis muscle was dissected free.  Entry into the peritoneal cavity was accomplished sharply.  A bladder blade was placed and the vesicular uterine peritoneal fold was identified and bladder flap was created and the bladder was reflected inferiorly.  Low transverse uterine incision was made upon  entry into the endometrial cavity.  Clear fluid resulted.  Extremely wedged fetal head was then delivered through the incision followed by shoulders and body.  Large vigorous female was then dried on the abdomen after 60 seconds passed, the cord was doubly clamped and the female was passed to nursery staff who assigned Apgars of 9 and 9, weight 4100 g (9 pound 1 ounce).  The placenta was manually delivered and the uterus was exteriorized and the endometrial cavity was wiped clean with laparotomy tape.  Uterine incision was then closed with 1 chromic suture in a running locking fashion.  Good approximation of edges.  Good hemostasis noted. Fallopian tubes and ovaries appeared normal.  Posterior cul-de-sac was irrigated and suctioned and the uterus was placed back into the abdominal cavity.  Paracolic gutters were then wiped clean with laparotomy tape and the uterine incision again appeared hemostatic. Interceed was placed over the uterine incision in a T-shaped fashion. Fascia was then closed with 0 Vicryl suture in a running nonlocking fashion.  Good approximation of edges.  Good hemostasis was noted. Subcutaneous tissue was then irrigated and Bovie for hemostasis and given the depth of the subcutaneous tissue plane approximately 3.5 cm, a running 2-0 chromic suture was used to close dead space in the subcutaneous tissue plane.  Skin was then reapproximated with Insorb  staples.  Good cosmetic effect.  Good hemostasis was noted.  There were no complications.  ESTIMATED BLOOD LOSS:  500 mL.  INTRAOPERATIVE FLUIDS:  700 mL.  URINE OUTPUT:  50 mL.  Patient was taken to recovery room in good condition.  BIRTH TIME:  1944.    ______________________________ Jennell Cornerhomas Schermerhorn, MD   ______________________________ Jennell Cornerhomas Schermerhorn, MD    TS/MEDQ  D:  04/28/2016  T:  04/29/2016  Job:  [id number]

## 2016-04-30 NOTE — Discharge Summary (Signed)
Obstetrical Discharge Summary  Patient Name: Pamela Robertson DOB: 04/07/1991 MRN: [id number]  Date of Admission: 04/28/2016 Date of Discharge: 05/01/2016  [facility]  Gestational Age at Delivery: [id number]   Antepartum complications: low PAPP-A, FOB with neurofibromatosis type 1, elevated DS screening with neg NIPT  Admitting Diagnosis: SROM Secondary Diagnosis: Patient Active Problem List   Diagnosis Date Noted  . Post-operative state 04/29/2016  . Pregnancy 04/28/2016  . Uterine contractions during pregnancy 04/28/2016  . Family history of neurofibromatosis, type 1 (von Recklinghausen's disease)   . 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 Bipolar I disorder, most recent episode (or current) mixed, moderate 10/06/2012  . Unspecified gastritis and gastroduodenitis without mention of hemorrhage 09/06/2012  . Routine general medical examination at a health care facility 09/04/2012  . Obesity (BMI 30-39.9) 12/23/2011  . 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 Depression   . Menstrual irregularity     Augmentation: Pitocin Complications: None Intrapartum complications/course: patient presented with SROM and active labor, was augmented with pitocin, and had FSE/IUPC place with adequate MVU and active phase arrest with LTCS.  Date of Delivery:  04/28/2016 Delivered By: Maisie Fushomas Schermerhorn Delivery Type: primary cesarean section, low transverse incision Anesthesia: epidural Placenta: sponatneous Laceration: n/a Episiotomy: n/a Newborn Data: Live born female  Birth Weight: 9 lb 0.6 oz (4100 g) APGAR: 9, 9    Brief Hospital Course  Patient had an uncomplicated surgery; for further details of this surgery, please refer to the operative note.  Patient had an uncomplicated postpartum course.  By time of discharge on POD#2, her pain was controlled on oral pain medications; she had appropriate lochia and was ambulating, voiding without difficulty, tolerating  regular diet and passing flatus.   She was deemed stable for discharge to home.    Discharge Physical Exam:  BP (!) 102/51 (BP Location: Left Arm)   Pulse 80   Temp 98 F (36.7 C) (Oral)   Resp 20   Ht 5\' 1"  (1.549 m)   Wt 119.3 kg (263 lb)   LMP 06/30/2015   SpO2 99%   Breastfeeding? Unknown   BMI 49.69 kg/m   General: NAD CV: RRR Pulm: CTABL, nl effort ABD: s/nd/nt, fundus firm and below the umbilicus Lochia: moderate Incision: c/d/i DVT Evaluation: LE non-ttp, no evidence of DVT on exam.  Hemoglobin  Date Value Ref Range Status  04/29/2016 8.8 (L) 12.0 - 16.0 g/dL Final   HCT  Date Value Ref Range Status  04/29/2016 26.9 (L) 35.0 - 47.0 % Final     Disposition: stable, discharge to home. Baby Feeding: formula Baby Disposition: home with mom  Rh Immune globulin given: n/a Rubella vaccine given: n/a Tdap vaccine given in AP or PP setting: AP Flu vaccine given in AP or PP setting: AP  Contraception: undecided, briefly reviewed optionsm TBD at 6wk visit  Prenatal Labs:  ABO, Rh: --/--/O POS (11/26 [id number]) Antibody: NEG (11/26 0438) Rubella:  imm RPR:   neg HBsAg:   neg HIV:   neg GBS:   neg   Plan:  Pamela Robertson was discharged to home in good condition. [facility]  Discharge Medications:   Medication List    STOP taking these medications   promethazine 25 MG tablet Commonly known as:  PHENERGAN     TAKE these medications   acetaminophen 325 MG tablet Commonly known as:  TYLENOL Take 2 tablets (650 mg total) by mouth every 4 (four)  hours as needed (for pain scale < 4).   flintstones complete 60 MG chewable tablet Chew 2 tablets by mouth daily.   ibuprofen 600 MG tablet Commonly known as:  ADVIL,MOTRIN Take 1 tablet (600 mg total) by mouth every 6 (six) hours.   mometasone 0.1 % ointment Commonly known as:  ELOCON Apply topically daily. To ear canal   oxyCODONE-acetaminophen 5-325 MG  tablet Commonly known as:  ROXICET Take 1 tablet by mouth every 4 (four) hours as needed for severe pain.       Follow-up Information    SCHERMERHORN,THOMAS, MD. Go on 05/13/2016.   Specialty:  Obstetrics and Gynecology Why:  05/13/16 @ 2:15 pm for post op check. Contact information: [address] [facility] [place] [id number] [phone]           Signed: ----- Ranae Plumberhelsea Karthika Glasper, MD Attending Obstetrician and Gynecologist [facility], Department of OB/GYN [facility]

## 2016-04-30 NOTE — Discharge Instructions (Signed)

## 2016-04-30 NOTE — Progress Notes (Signed)
Pt discharged home with infant.  Discharge instructions and follow up appointment given to and reviewed with pt.  Pt verbalized understanding.  Escorted by auxillary. 

## 2016-04-30 NOTE — Progress Notes (Signed)
  Subjective:   Doing well.  No complaints.  Voiding, ambulating, tolerating regular PO diet, tolerating pain with PO meds. Denies: CP SOB F/C, N/V, calf pain    Objective:  Blood pressure 112/73, pulse 87, temperature 97.8 F (36.6 C), temperature source Oral, resp. rate 18, height 5\' 1"  (1.549 m), weight 119.3 kg (263 lb), last menstrual period 06/30/2015, SpO2 99 %, unknown if currently breastfeeding.  General: NAD Pulmonary: no increased work of breathing Abdomen: non-distended, non-tender, fundus firm at level of umbilicus Incision: c/d/i Extremities: no edema, no erythema, no tenderness    Assessment:   25 y.o. G1P1001 postoperativeday # 2   Plan:  1) Acute blood loss anemia - hemodynamically stable and asymptomatic - po ferrous sulfate  2) TDAP UTD  3) Bottle feeding, offer support  4) Disposition: continue inpatient care  ----- Pamela Plumberhelsea Abia Monaco, MD Attending Obstetrician and Gynecologist [facility], Department of OB/GYN [facility]

## 2016-05-02 ENCOUNTER — Encounter: Payer: Self-pay | Admitting: Obstetrics and Gynecology

## 2017-02-26 NOTE — Telephone Encounter (Signed)
Error

## 2017-09-06 ENCOUNTER — Other Ambulatory Visit: Payer: Self-pay

## 2017-09-06 ENCOUNTER — Emergency Department
Admission: EM | Admit: 2017-09-06 | Discharge: 2017-09-07 | Disposition: A | Discharge status: Home / Self Care | Payer: Medicaid Other | Attending: Emergency Medicine | Admitting: Emergency Medicine

## 2017-09-06 DIAGNOSIS — F319 Bipolar disorder, unspecified: Secondary | ICD-10-CM | POA: Diagnosis not present

## 2017-09-06 DIAGNOSIS — Z3A01 Less than 8 weeks gestation of pregnancy: Secondary | ICD-10-CM | POA: Diagnosis not present

## 2017-09-06 DIAGNOSIS — O99341 Other mental disorders complicating pregnancy, first trimester: Secondary | ICD-10-CM | POA: Insufficient documentation

## 2017-09-06 DIAGNOSIS — Z87891 Personal history of nicotine dependence: Secondary | ICD-10-CM | POA: Diagnosis not present

## 2017-09-06 DIAGNOSIS — O219 Vomiting of pregnancy, unspecified: Secondary | ICD-10-CM | POA: Diagnosis present

## 2017-09-06 DIAGNOSIS — O210 Mild hyperemesis gravidarum: Secondary | ICD-10-CM | POA: Diagnosis not present

## 2017-09-06 LAB — URINALYSIS, COMPLETE (UACMP) WITH MICROSCOPIC
BILIRUBIN URINE: NEGATIVE
Bacteria, UA: NONE SEEN
Glucose, UA: NEGATIVE mg/dL
Hgb urine dipstick: NEGATIVE
Ketones, ur: 80 mg/dL — AB
Leukocytes, UA: NEGATIVE
Nitrite: NEGATIVE
PH: 5 (ref 5.0–8.0)
Protein, ur: 100 mg/dL — AB
SPECIFIC GRAVITY, URINE: 1.034 — AB (ref 1.005–1.030)

## 2017-09-06 LAB — COMPREHENSIVE METABOLIC PANEL
ALK PHOS: 68 U/L (ref 38–126)
ALT: 12 U/L — AB (ref 14–54)
AST: 16 U/L (ref 15–41)
Albumin: 4.6 g/dL (ref 3.5–5.0)
Anion gap: 10 (ref 5–15)
BILIRUBIN TOTAL: 1.1 mg/dL (ref 0.3–1.2)
BUN: 16 mg/dL (ref 6–20)
CALCIUM: 9.6 mg/dL (ref 8.9–10.3)
CHLORIDE: 103 mmol/L (ref 101–111)
CO2: 25 mmol/L (ref 22–32)
CREATININE: 0.78 mg/dL (ref 0.44–1.00)
GFR calc Af Amer: >60 mL/min (ref >60)
Glucose, Bld: 101 mg/dL — ABNORMAL HIGH (ref 65–99)
Potassium: 3.7 mmol/L (ref 3.5–5.1)
Sodium: 138 mmol/L (ref 135–145)
Total Protein: 9.1 g/dL — ABNORMAL HIGH (ref 6.5–8.1)

## 2017-09-06 LAB — CBC
HCT: 41.1 % (ref 35.0–47.0)
Hemoglobin: 13.3 g/dL (ref 12.0–16.0)
MCH: 28.1 pg (ref 26.0–34.0)
MCHC: 32.3 g/dL (ref 32.0–36.0)
MCV: 86.8 fL (ref 80.0–100.0)
PLATELETS: 386 10*3/uL (ref 150–440)
RBC: 4.74 MIL/uL (ref 3.80–5.20)
RDW: 16.2 % — AB (ref 11.5–14.5)
WBC: 12.2 10*3/uL — AB (ref 3.6–11.0)

## 2017-09-06 LAB — LIPASE, BLOOD: LIPASE: 21 U/L (ref 11–51)

## 2017-09-06 LAB — OB RESULTS CONSOLE GC/CHLAMYDIA: Chlamydia: NEGATIVE

## 2017-09-06 MED ORDER — ONDANSETRON HCL 4 MG/2ML IJ SOLN
4.0000 mg | Freq: Once | INTRAMUSCULAR | Status: AC
  Administered 2017-09-06: 4 mg via INTRAVENOUS
  Filled 2017-09-06: qty 2

## 2017-09-06 MED ORDER — ONDANSETRON 4 MG PO TBDP
4.0000 mg | ORAL_TABLET | Freq: Once | ORAL | Status: AC
  Administered 2017-09-06: 4 mg via ORAL
  Filled 2017-09-06: qty 1

## 2017-09-06 MED ORDER — DEXTROSE-NACL 5-0.45 % IV SOLN
INTRAVENOUS | Status: DC
  Administered 2017-09-06: 1000 mL via INTRAVENOUS

## 2017-09-06 NOTE — ED Notes (Signed)
Patient reports continued nausea and discomfort. [facility]. MD ordered 4 mg ODT Zofran.

## 2017-09-06 NOTE — ED Notes (Signed)
8weeks pregnany. NV since Friday, diarrhea. Denies vagnial bleeding. Pt alert and oriented X4, active, cooperative, pt in NAD. RR even and unlabored, color WNL.

## 2017-09-06 NOTE — ED Provider Notes (Signed)
[facility] Emergency Department Provider Note   ____________________________________________   First MD Initiated Contact with Patient 09/06/17 2304     (approximate)  I have reviewed the triage vital signs and the nursing notes.   HISTORY  Chief Complaint Emesis During Pregnancy    HPI Pamela Robertson is a 27 y.o. female G2P1 approximately [redacted] weeks pregnant by dates who presents with nausea/vomiting and diarrhea.  Patient reports nausea/vomiting for the past 4-5 days.  Has had some diarrhea today.  States her throat and abdomen are sore from vomiting.  Abdomen does not hurt when she is not vomiting.  Denies vaginal discharge, bleeding, dysuria.  Denies fever, chills, chest pain, shortness of breath.  Denies recent travel or trauma.   Past Medical History:  Diagnosis Date  . Bipolar 1 disorder, depressed, full remission (HCC)    controlled with lamictal and prozac  . Depression 2010   hosp for suical ideation at [facility]   . Menstrual irregularity    occurring every 2 or 3 months    Patient Active Problem List   Diagnosis Date Noted  . Post-operative state 04/29/2016  . Pregnancy 04/28/2016  . Uterine contractions during pregnancy 04/28/2016  . Family history of neurofibromatosis, type 1 (von Recklinghausen's disease)   . 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 Bipolar I disorder, most recent episode (or current) mixed, moderate 10/06/2012  . Unspecified gastritis and gastroduodenitis without mention of hemorrhage 09/06/2012  . Routine general medical examination at a health care facility 09/04/2012  . Obesity (BMI 30-39.9) 12/23/2011  . 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 Depression   . Menstrual irregularity     Past Surgical History:  Procedure Laterality Date  . CESAREAN SECTION  04/28/2016   Procedure: CESAREAN SECTION;  Surgeon: Suzy Bouchard, MD;  Location: [facility] ORS;  Service: Obstetrics;;  . NO PAST SURGERIES       Prior to Admission medications   Medication Sig Start Date End Date Taking? Authorizing Provider  acetaminophen (TYLENOL) 325 MG tablet Take 2 tablets (650 mg total) by mouth every 4 (four) hours as needed (for pain scale < 4). 04/30/16   Ward, Elenora Fender, MD  flintstones complete (FLINTSTONES) 60 MG chewable tablet Chew 2 tablets by mouth daily.    [provider]  ibuprofen (ADVIL,MOTRIN) 600 MG tablet Take 1 tablet (600 mg total) by mouth every 6 (six) hours. 04/30/16   Ward, Elenora Fender, MD  mometasone (ELOCON) 0.1 % ointment Apply topically daily. To ear canal 10/21/13   Sherlene Shams, MD  oxyCODONE-acetaminophen (ROXICET) 5-325 MG tablet Take 1 tablet by mouth every 4 (four) hours as needed for severe pain. 04/30/16   Ward, Elenora Fender, MD    Allergies Patient has no known allergies.  Family History  Problem Relation Age of Onset  . Diabetes Father     Social History Social History   Tobacco Use  . Smoking status: Former Smoker    Packs/day: 0.10    Types: Cigarettes    Last attempt to quit: 05/04/2011    Years since quitting: 6.3  . Smokeless tobacco: Never Used  . Tobacco comment: not daily,  less than pack/month  Substance Use Topics  . Alcohol use: No    Comment: last use 06/30/15  . Drug use: No    Review of Systems  Constitutional: No fever/chills. Eyes: No visual changes. ENT: No sore throat. Cardiovascular: Denies chest pain. Respiratory: Denies shortness of breath. Gastrointestinal: No abdominal pain.,  Positive for nausea, vomiting and diarrhea.  No constipation. Genitourinary: Negative for dysuria. Musculoskeletal: Negative for back pain. Skin: Negative for rash. Neurological: Negative for headaches, focal weakness or numbness.   ____________________________________________   PHYSICAL EXAM:  VITAL SIGNS: ED Triage Vitals  Enc Vitals Group     BP 09/06/17 1552 119/77     Pulse Rate 09/06/17 1552 83     Resp 09/06/17 1552 18     Temp  09/06/17 1552 98.1 F (36.7 C)     Temp Source 09/06/17 1552 Oral     SpO2 09/06/17 1552 97 %     Weight --      Height 09/06/17 1553 5\' 2"  (1.575 m)     Head Circumference --      Peak Flow --      Pain Score 09/06/17 1553 0     Pain Loc --      Pain Edu? --      Excl. in GC? --     Constitutional: Alert and oriented. Well appearing and in no acute distress. Eyes: Conjunctivae are normal. PERRL. EOMI. Head: Atraumatic. Nose: No congestion/rhinnorhea. Mouth/Throat: Mucous membranes are mildly dry.  Oropharynx non-erythematous. Neck: No stridor.   Cardiovascular: Normal rate, regular rhythm. Grossly normal heart sounds.  Good peripheral circulation. Respiratory: Normal respiratory effort.  No retractions. Lungs CTAB. Gastrointestinal: Soft and nontender to light or deep palpation. No distention. No abdominal bruits. No CVA tenderness. Musculoskeletal: No lower extremity tenderness nor edema.  No joint effusions. Neurologic:  Normal speech and language. No gross focal neurologic deficits are appreciated. No gait instability. Skin:  Skin is warm, dry and intact. No rash noted. Psychiatric: Mood and affect are normal. Speech and behavior are normal.  ____________________________________________   LABS (all labs ordered are listed, but only abnormal results are displayed)  Labs Reviewed  COMPREHENSIVE METABOLIC PANEL - Abnormal; Notable for the following components:      Result Value   Glucose, Bld 101 (*)    Total Protein 9.1 (*)    ALT 12 (*)    All other components within normal limits  CBC - Abnormal; Notable for the following components:   WBC 12.2 (*)    RDW 16.2 (*)    All other components within normal limits  URINALYSIS, COMPLETE (UACMP) WITH MICROSCOPIC - Abnormal; Notable for the following components:   Color, Urine AMBER (*)    APPearance CLOUDY (*)    Specific Gravity, Urine 1.034 (*)    Ketones, ur 80 (*)    Protein, ur 100 (*)    Squamous Epithelial / LPF  6-30 (*)    All other components within normal limits  LIPASE, BLOOD  POC URINE PREG, ED   ____________________________________________  EKG  None ____________________________________________  RADIOLOGY  ED MD interpretation: None  Official radiology report(s): No results found.  ____________________________________________   PROCEDURES  Procedure(s) performed: None  Procedures  Critical Care performed: No  ____________________________________________   INITIAL IMPRESSION / ASSESSMENT AND PLAN / ED COURSE  As part of my medical decision making, I reviewed the following data within the electronic MEDICAL RECORD NUMBER Nursing notes reviewed and incorporated, Labs reviewed  and Notes from prior ED visits   27 year old female G2P1 approximately [redacted] weeks pregnant by dates who presents with nausea/vomiting greater than diarrhea.  Differential diagnosis includes but is not limited to hyperemesis gravidarum, gastroenteritis, gastritis, cholecystitis, appendicitis, etc.  Laboratory and urinalysis results remarkable for ketones in the urine consistent with mild dehydration.  Patient  received ODT Zofran approximately 8:45 PM and states it helped with her nausea but no nausea is returning.  Will infuse IV fluids, IV antiemetic and reassess.  Denies abdominal pain, vaginal discharge or bleeding.  Clinical Course as of Sep 08 503  Wynelle LinkSun Sep 07, 2017  0131 Patient tolerated ice chips without emesis.  Bedside ultrasound demonstrates gestational sac in the uterus without fetal cardiac activity.  Likely patient is in the very early stages of her pregnancy.  Will discharge home with prescription for antiemetic and [facility].  Strict return precautions given.  Patient verbalizes understanding and agrees with plan of care.   [JS]    Clinical Course User Index [JS] Irean HongSung, Baker Moronta J, MD     ____________________________________________   FINAL CLINICAL  IMPRESSION(S) / ED DIAGNOSES  Final diagnoses:  Hyperemesis gravidarum     ED Discharge Orders    None       Note:  This document was prepared using Dragon voice recognition software and may include unintentional dictation errors.    Irean HongSung, Elza Varricchio J, MD 09/07/17 [phone]

## 2017-09-06 NOTE — ED Triage Notes (Signed)
Pt presents to ED with c/o of vomiting x 4-5 days. [redacted] weeks pregnant, 2nd pregnancy. Alert, oriented, ambulatory. States some diarrhea "when I woke up." has not established OB care yet. Denies vaginal bleeding or discharge. Denies pain.

## 2017-09-07 LAB — HCG, QUANTITATIVE, PREGNANCY: HCG, BETA CHAIN, QUANT, S: 34119 m[IU]/mL — AB (ref <5)

## 2017-09-07 MED ORDER — ONDANSETRON 4 MG PO TBDP: 4.0000 mg | ORAL_TABLET | Freq: Three times a day (TID) | ORAL | 0 refills | Status: DC | PRN

## 2017-09-07 NOTE — ED Notes (Signed)
Pt alert and oriented X4, active, cooperative, pt in NAD. RR even and unlabored, color WNL.  Pt informed to return if any life threatening symptoms occur.  Discharge and followup instructions reviewed.  

## 2017-09-07 NOTE — ED Notes (Signed)
Eating ice chips without emesis.

## 2017-09-07 NOTE — Discharge Instructions (Addendum)
1.  You may take Zofran as needed for nausea/vomiting. 2.  Clear liquids for 12 hours, then bland diet for 3 days, then slowly advance diet as tolerated. 3.  Return to the ER for worsening symptoms, persistent vomiting, difficulty breathing, vaginal bleeding or other concerns.

## 2017-09-14 ENCOUNTER — Inpatient Hospital Stay: Payer: Medicaid Other

## 2017-09-14 ENCOUNTER — Other Ambulatory Visit: Payer: Self-pay

## 2017-09-14 ENCOUNTER — Encounter: Payer: Self-pay | Admitting: Emergency Medicine

## 2017-09-14 ENCOUNTER — Inpatient Hospital Stay
Admission: EM | Admit: 2017-09-14 | Discharge: 2017-09-17 | DRG: 832 | Disposition: A | Discharge status: Home / Self Care | Payer: Medicaid Other | Attending: Obstetrics and Gynecology | Admitting: Obstetrics and Gynecology

## 2017-09-14 DIAGNOSIS — E669 Obesity, unspecified: Secondary | ICD-10-CM | POA: Diagnosis present

## 2017-09-14 DIAGNOSIS — O99341 Other mental disorders complicating pregnancy, first trimester: Secondary | ICD-10-CM | POA: Diagnosis present

## 2017-09-14 DIAGNOSIS — Z87891 Personal history of nicotine dependence: Secondary | ICD-10-CM

## 2017-09-14 DIAGNOSIS — O99211 Obesity complicating pregnancy, first trimester: Secondary | ICD-10-CM | POA: Diagnosis present

## 2017-09-14 DIAGNOSIS — O211 Hyperemesis gravidarum with metabolic disturbance: Principal | ICD-10-CM | POA: Diagnosis present

## 2017-09-14 DIAGNOSIS — F3130 Bipolar disorder, current episode depressed, mild or moderate severity, unspecified: Secondary | ICD-10-CM | POA: Diagnosis present

## 2017-09-14 DIAGNOSIS — Z3A01 Less than 8 weeks gestation of pregnancy: Secondary | ICD-10-CM | POA: Diagnosis not present

## 2017-09-14 DIAGNOSIS — O34211 Maternal care for low transverse scar from previous cesarean delivery: Secondary | ICD-10-CM | POA: Diagnosis present

## 2017-09-14 DIAGNOSIS — O210 Mild hyperemesis gravidarum: Secondary | ICD-10-CM | POA: Diagnosis present

## 2017-09-14 LAB — DIFFERENTIAL
BASOS ABS: 0.1 10*3/uL (ref 0–0.1)
Basophils Relative: 1 %
Eosinophils Absolute: 0 10*3/uL (ref 0–0.7)
Eosinophils Relative: 0 %
LYMPHS PCT: 10 %
Lymphs Abs: 1.2 10*3/uL (ref 1.0–3.6)
MONO ABS: 1.2 10*3/uL — AB (ref 0.2–0.9)
Monocytes Relative: 9 %
Neutro Abs: 10.4 10*3/uL — ABNORMAL HIGH (ref 1.4–6.5)
Neutrophils Relative %: 80 %

## 2017-09-14 LAB — CBC
HEMATOCRIT: 44.6 % (ref 35.0–47.0)
HEMATOCRIT: 39.2 % (ref 35.0–47.0)
Hemoglobin: 15 g/dL (ref 12.0–16.0)
Hemoglobin: 12.9 g/dL (ref 12.0–16.0)
MCH: 29 pg (ref 26.0–34.0)
MCH: 28.7 pg (ref 26.0–34.0)
MCHC: 33.6 g/dL (ref 32.0–36.0)
MCHC: 32.9 g/dL (ref 32.0–36.0)
MCV: 86.3 fL (ref 80.0–100.0)
MCV: 87.2 fL (ref 80.0–100.0)
PLATELETS: 392 10*3/uL (ref 150–440)
PLATELETS: 307 10*3/uL (ref 150–440)
RBC: 5.17 MIL/uL (ref 3.80–5.20)
RBC: 4.5 MIL/uL (ref 3.80–5.20)
RDW: 15.5 % — AB (ref 11.5–14.5)
RDW: 15.9 % — ABNORMAL HIGH (ref 11.5–14.5)
WBC: 16 10*3/uL — ABNORMAL HIGH (ref 3.6–11.0)
WBC: 12.9 10*3/uL — AB (ref 3.6–11.0)

## 2017-09-14 LAB — URINALYSIS, COMPLETE (UACMP) WITH MICROSCOPIC
BACTERIA UA: NONE SEEN
Bilirubin Urine: NEGATIVE
Glucose, UA: NEGATIVE mg/dL
Hgb urine dipstick: NEGATIVE
Ketones, ur: 80 mg/dL — AB
Nitrite: NEGATIVE
PROTEIN: 100 mg/dL — AB
SPECIFIC GRAVITY, URINE: 1.03 (ref 1.005–1.030)
pH: 6 (ref 5.0–8.0)

## 2017-09-14 LAB — COMPREHENSIVE METABOLIC PANEL
ALBUMIN: 4.4 g/dL (ref 3.5–5.0)
ALT: 11 U/L — AB (ref 14–54)
AST: 17 U/L (ref 15–41)
Alkaline Phosphatase: 66 U/L (ref 38–126)
Anion gap: 16 — ABNORMAL HIGH (ref 5–15)
BUN: 20 mg/dL (ref 6–20)
CHLORIDE: 94 mmol/L — AB (ref 101–111)
CO2: 21 mmol/L — ABNORMAL LOW (ref 22–32)
CREATININE: 0.75 mg/dL (ref 0.44–1.00)
Calcium: 9.3 mg/dL (ref 8.9–10.3)
GFR calc Af Amer: >60 mL/min (ref >60)
GFR calc non Af Amer: >60 mL/min (ref >60)
GLUCOSE: 108 mg/dL — AB (ref 65–99)
Potassium: 3 mmol/L — ABNORMAL LOW (ref 3.5–5.1)
Sodium: 131 mmol/L — ABNORMAL LOW (ref 135–145)
Total Bilirubin: 1.3 mg/dL — ABNORMAL HIGH (ref 0.3–1.2)
Total Protein: 9.2 g/dL — ABNORMAL HIGH (ref 6.5–8.1)

## 2017-09-14 LAB — TYPE AND SCREEN
ABO/RH(D): O POS
Antibody Screen: NEGATIVE

## 2017-09-14 MED ORDER — PROMETHAZINE HCL 25 MG/ML IJ SOLN
25.0000 mg | Freq: Four times a day (QID) | INTRAMUSCULAR | Status: DC
  Administered 2017-09-14 – 2017-09-15 (×4): 25 mg via INTRAVENOUS
  Filled 2017-09-14 (×4): qty 1

## 2017-09-14 MED ORDER — ACETAMINOPHEN 325 MG PO TABS: 650.0000 mg | ORAL_TABLET | ORAL | Status: DC | PRN

## 2017-09-14 MED ORDER — DOCUSATE SODIUM 100 MG PO CAPS
100.0000 mg | ORAL_CAPSULE | Freq: Every day | ORAL | Status: DC
  Administered 2017-09-15 – 2017-09-17 (×3): 100 mg via ORAL
  Filled 2017-09-14 (×3): qty 1

## 2017-09-14 MED ORDER — ZOLPIDEM TARTRATE 5 MG PO TABS: 5.0000 mg | ORAL_TABLET | Freq: Every evening | ORAL | Status: DC | PRN

## 2017-09-14 MED ORDER — POTASSIUM CHLORIDE 10 MEQ/100ML IV SOLN
10.0000 meq | INTRAVENOUS | Status: DC
  Filled 2017-09-14 (×3): qty 100

## 2017-09-14 MED ORDER — M.V.I. ADULT IV INJ
Freq: Once | INTRAVENOUS | Status: AC
  Administered 2017-09-14: 18:00:00 via INTRAVENOUS
  Filled 2017-09-14: qty 10

## 2017-09-14 MED ORDER — ONDANSETRON HCL 4 MG/2ML IJ SOLN
4.0000 mg | Freq: Once | INTRAMUSCULAR | Status: AC
  Administered 2017-09-14: 4 mg via INTRAVENOUS
  Filled 2017-09-14: qty 2

## 2017-09-14 MED ORDER — KCL-LACTATED RINGERS-D5W 20 MEQ/L IV SOLN
INTRAVENOUS | Status: DC
Start: 2017-09-14 — End: 2017-09-16
  Administered 2017-09-14 – 2017-09-16 (×4): via INTRAVENOUS
  Filled 2017-09-14 (×9): qty 1000

## 2017-09-14 MED ORDER — POTASSIUM CHLORIDE 20 MEQ/15ML (10%) PO SOLN
40.0000 meq | Freq: Once | ORAL | Status: AC
  Administered 2017-09-14: 40 meq via ORAL
  Filled 2017-09-14: qty 30

## 2017-09-14 MED ORDER — POTASSIUM CHLORIDE 10 MEQ/100ML IV SOLN
10.0000 meq | INTRAVENOUS | Status: AC
  Administered 2017-09-14 (×3): 10 meq via INTRAVENOUS
  Filled 2017-09-14 (×3): qty 100

## 2017-09-14 MED ORDER — SODIUM CHLORIDE 0.9 % IV BOLUS
1000.0000 mL | Freq: Once | INTRAVENOUS | Status: AC
  Administered 2017-09-14: 1000 mL via INTRAVENOUS

## 2017-09-14 MED ORDER — DEXTROSE 5 % AND 0.45 % NACL IV BOLUS
1000.0000 mL | Freq: Once | INTRAVENOUS | Status: AC
  Administered 2017-09-14: 1000 mL via INTRAVENOUS
  Filled 2017-09-14: qty 1000

## 2017-09-14 MED ORDER — METOCLOPRAMIDE HCL 5 MG/ML IJ SOLN
10.0000 mg | Freq: Three times a day (TID) | INTRAMUSCULAR | Status: DC
  Administered 2017-09-14 – 2017-09-16 (×5): 10 mg via INTRAVENOUS
  Filled 2017-09-14 (×6): qty 2

## 2017-09-14 MED ORDER — PRENATAL MULTIVITAMIN CH
1.0000 | ORAL_TABLET | Freq: Every day | ORAL | Status: DC
  Administered 2017-09-15 – 2017-09-16 (×2): 1 via ORAL
  Filled 2017-09-14 (×2): qty 1

## 2017-09-14 MED ORDER — CALCIUM CARBONATE ANTACID 500 MG PO CHEW: 2.0000 | CHEWABLE_TABLET | ORAL | Status: DC | PRN

## 2017-09-14 MED ORDER — SODIUM CHLORIDE 0.9 % IV BOLUS: 1000.0000 mL | Freq: Once | INTRAVENOUS | Status: AC

## 2017-09-14 MED ORDER — DOXYLAMINE SUCCINATE (SLEEP) 25 MG PO TABS
25.0000 mg | ORAL_TABLET | Freq: Two times a day (BID) | ORAL | Status: DC
  Administered 2017-09-14 – 2017-09-17 (×6): 25 mg via ORAL
  Filled 2017-09-14 (×7): qty 1

## 2017-09-14 NOTE — ED Provider Notes (Addendum)
[facility] Emergency Department Provider Note    First MD Initiated Contact with Patient 09/14/17 1041     (approximate)  I have reviewed the triage vital signs and the nursing notes.   HISTORY  Chief Complaint Emesis    HPI Pamela Robertson is a 27 y.o. female G2 P1 proximally [redacted] weeks pregnant with below list of chronic medical conditions presents to the emergency department withvomiting 10 days with inability to tolerate by mouth liquids and food. Patient states that she's lost approximately 9 pounds over the last week. Patient has Phenergan prescribed for home however states that it has rendered no relief for her vomiting. Patient denies any abdominal or pelvic pain. Patient denies any vaginal bleeding.   Past Medical History:  Diagnosis Date  . Bipolar 1 disorder, depressed, full remission (HCC)    controlled with lamictal and prozac  . Depression 2010   [facility]   . Menstrual irregularity    occurring every 2 or 3 months    Patient Active Problem List   Diagnosis Date Noted  . Post-operative state 04/29/2016  . Pregnancy 04/28/2016  . Uterine contractions during pregnancy 04/28/2016  . Family history of neurofibromatosis, type 1 (von Recklinghausen's disease)   . 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 Bipolar I disorder, most recent episode (or current) mixed, moderate 10/06/2012  . Unspecified gastritis and gastroduodenitis without mention of hemorrhage 09/06/2012  . Routine general medical examination at a health care facility 09/04/2012  . Obesity (BMI 30-39.9) 12/23/2011  . 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 Depression   . Menstrual irregularity     Past Surgical History:  Procedure Laterality Date  . CESAREAN SECTION  04/28/2016   Procedure: CESAREAN SECTION;  Surgeon: Suzy Bouchardhomas J Schermerhorn, MD;  Location: [facility] ORS;  Service: Obstetrics;;  . NO PAST SURGERIES      Prior to Admission  medications   Medication Sig Start Date End Date Taking? Authorizing Provider  promethazine (PHENERGAN) 25 MG suppository Place 25 mg rectally every 6 (six) hours as needed for nausea or vomiting.   Yes [provider]  promethazine (PHENERGAN) 25 MG tablet Take 25 mg by mouth every 6 (six) hours as needed for nausea or vomiting.   Yes [provider]  acetaminophen (TYLENOL) 325 MG tablet Take 2 tablets (650 mg total) by mouth every 4 (four) hours as needed (for pain scale < 4). Patient not taking: Reported on 09/14/2017 04/30/16   Ward, Elenora Fenderhelsea C, MD  flintstones complete (FLINTSTONES) 60 MG chewable tablet Chew 2 tablets by mouth daily.    [provider]  ibuprofen (ADVIL,MOTRIN) 600 MG tablet Take 1 tablet (600 mg total) by mouth every 6 (six) hours. Patient not taking: Reported on 09/14/2017 04/30/16   Ward, Elenora Fenderhelsea C, MD  mometasone (ELOCON) 0.1 % ointment Apply topically daily. To ear canal Patient not taking: Reported on 09/14/2017 10/21/13   Sherlene Shamsullo, Teresa L, MD  ondansetron (ZOFRAN ODT) 4 MG disintegrating tablet Take 1 tablet (4 mg total) by mouth every 8 (eight) hours as needed for nausea or vomiting. Patient not taking: Reported on 09/14/2017 09/07/17   Irean HongSung, Jade J, MD  oxyCODONE-acetaminophen (ROXICET) 5-325 MG tablet Take 1 tablet by mouth every 4 (four) hours as needed for severe pain. Patient not taking: Reported on 09/14/2017 04/30/16   Ward, Elenora Fenderhelsea C, MD    Allergies no known drug allergies  Family History  Problem Relation Age of Onset  .  Diabetes Father     Social History Social History   Tobacco Use  . Smoking status: Former Smoker    Packs/day: 0.10    Types: Cigarettes    Last attempt to quit: 05/04/2011    Years since quitting: 6.3  . Smokeless tobacco: Never Used  . Tobacco comment: not daily,  less than pack/month  Substance Use Topics  . Alcohol use: No    Comment: last use 06/30/15  . Drug use: No    Review of  Systems Constitutional: No fever/chills Eyes: No visual changes. ENT: No sore throat. Cardiovascular: Denies chest pain. Respiratory: Denies shortness of breath. Gastrointestinal: No abdominal pain.  No nausea,positive for vomiting vomiting.  No diarrhea.  No constipation. Genitourinary: Negative for dysuria. Musculoskeletal: Negative for neck pain.  Negative for back pain. Integumentary: Negative for rash. Neurological: Negative for headaches, focal weakness or numbness.   ____________________________________________   PHYSICAL EXAM:  VITAL SIGNS: ED Triage Vitals  Enc Vitals Group     BP 09/14/17 1026 (!) 119/95     Pulse Rate 09/14/17 1026 (!) 124     Resp 09/14/17 1026 20     Temp 09/14/17 1026 98 F (36.7 C)     Temp Source 09/14/17 1026 Oral     SpO2 09/14/17 1026 98 %     Weight 09/14/17 1025 96.2 kg (212 lb)     Height 09/14/17 1025 1.549 m (5\' 1" )     Head Circumference --      Peak Flow --      Pain Score 09/14/17 1025 0     Pain Loc --      Pain Edu? --      Excl. in GC? --     Constitutional: Alert and oriented. Well appearing and in no acute distress. Eyes: Conjunctivae are normal.  Head: Atraumatic. Mouth/Throat: Mucous membranes are dry.  Oropharynx non-erythematous. Neck: No stridor.  Cardiovascular: Normal rate, regular rhythm. Good peripheral circulation. Grossly normal heart sounds. Respiratory: Normal respiratory effort.  No retractions. Lungs CTAB. Gastrointestinal: Soft and nontender. No distention.  Musculoskeletal: No lower extremity tenderness nor edema. No gross deformities of extremities. Neurologic:  Normal speech and language. No gross focal neurologic deficits are appreciated.  Skin:  Skin is warm, dry and intact. No rash noted. Psychiatric: Mood and affect are normal. Speech and behavior are normal.  ____________________________________________   LABS (all labs ordered are listed, but only abnormal results are displayed)  Labs  Reviewed  COMPREHENSIVE METABOLIC PANEL - Abnormal; Notable for the following components:      Result Value   Sodium 131 (*)    Potassium 3.0 (*)    Chloride 94 (*)    CO2 21 (*)    Glucose, Bld 108 (*)    Total Protein 9.2 (*)    ALT 11 (*)    Total Bilirubin 1.3 (*)    Anion gap 16 (*)    All other components within normal limits  CBC - Abnormal; Notable for the following components:   WBC 16.0 (*)    RDW 15.5 (*)    All other components within normal limits  URINALYSIS, COMPLETE (UACMP) WITH MICROSCOPIC - Abnormal; Notable for the following components:   Color, Urine YELLOW (*)    APPearance HAZY (*)    Ketones, ur 80 (*)    Protein, ur 100 (*)    Leukocytes, UA TRACE (*)    Squamous Epithelial / LPF 6-30 (*)    All other components within  normal limits         Procedures   ____________________________________________   INITIAL IMPRESSION / ASSESSMENT AND PLAN / ED COURSE  As part of my medical decision making, I reviewed the following data within the electronic MEDICAL RECORD NUMBER  27 year old female presented with above-stated history and physical exam consistent with hyperemesis gravidarum versus pregnancy-induced nausea and vomiting.  Patient clinically dehydrated on exam.  Laboratory data notable for hypokalemia with a potassium of 3.0 ketones noted in the urine 80.  Patient given D5 normal saline 1 L as well as normal saline 1 L IV.  She will continued nausea however no further vomiting while in the emergency department.  Patient admits to generalized weakness.  Patient discussed with Dr. Feliberto Gottron OB/GYN on-call for [facility] who will see the patient in consultation in the emergency department. ____________________________________________  FINAL CLINICAL IMPRESSION(S) / ED DIAGNOSES  Final diagnoses:  Hyperemesis gravidarum     MEDICATIONS GIVEN DURING THIS VISIT:  Medications  sodium chloride 0.9 % bolus 1,000 mL (1,000 mLs Intravenous New  Bag/Given 09/14/17 1421)  sodium chloride 0.9 % bolus 1,000 mL (has no administration in time range)  sodium chloride 0.9 % bolus 1,000 mL (0 mLs Intravenous Stopped 09/14/17 1420)  dextrose 5 % and 0.45% NaCl 5-0.45 % bolus 1,000 mL (0 mLs Intravenous Stopped 09/14/17 1420)  ondansetron (ZOFRAN) injection 4 mg (4 mg Intravenous Given 09/14/17 1050)  potassium chloride 20 MEQ/15ML (10%) solution 40 mEq (40 mEq Oral Given 09/14/17 1220)     ED Discharge Orders    None       Note:  This document was prepared using Dragon voice recognition software and may include unintentional dictation errors.    Darci Current, MD 09/14/17 1135    Darci Current, MD 09/14/17 [phone]

## 2017-09-14 NOTE — H&P (Signed)
Pamela Robertson is a 27 y.o. female presenting for worsening N/V . She is a G2P1  EGA 7+4 weeks based on LMP 07/23/17. No u/s yet . Over the past 5 days she has lost #9 and really cant keep any liquid or solid down .  She tried phenergan suppositories yesterday .  Marland KitchenS/P one caesarean section by me 2 yrs ago. She denies any liver , gallbladder issues, or thyroid issues  .New Ob appt has been set for early may at [facility]  OB History    Gravida  2   Para  1   Term  1   Preterm      AB      Living  1     SAB      TAB      Ectopic      Multiple  0   Live Births  1          Past Medical History:  Diagnosis Date  . Bipolar 1 disorder, depressed, full remission (HCC)    controlled with lamictal and prozac  . Depression 2010   hosp for [facility]ical ideation at [facility]   . Menstrual irregularity    occurring every 2 or 3 months   Past Surgical History:  Procedure Laterality Date  . CESAREAN SECTION  04/28/2016   Procedure: CESAREAN SECTION;  Surgeon: Suzy Bouchard, MD;  Location: [facility] ORS;  Service: Obstetrics;;  . NO PAST SURGERIES     Family History: family history includes Diabetes in her father. Social History:  reports that she quit smoking about 6 years ago. Her smoking use included cigarettes. She smoked 0.10 packs per day. She has never used smokeless tobacco. She reports that she does not drink alcohol or use drugs.     ROS  General : + weight loss pulm : no SOB   CV : no chest pain  GI: ++ nausea/ emesis Gyn : no pelvic pain , no bleeding  M/S : No muscle weakness Neuro: no seizure activity   History   Blood pressure 109/73, pulse 89, temperature 98 F (36.7 C), temperature source Oral, resp. rate 20, height 5\' 1"  (1.549 m), weight 96.2 kg (212 lb), SpO2 98 %, unknown if currently breastfeeding. Exam  WDWN black female in NAD   Lungs CTA   CV RRR without murmur   Abd : soft , NT + BS Pelvic deferred  M/s Normal strength  Labs:  Results for  orders placed or performed during the hospital encounter of 09/14/17 (from the past 24 hour(s))  Comprehensive metabolic panel     Status: Abnormal   Collection Time: 09/14/17 10:32 AM  Result Value Ref Range   Sodium 131 (L) 135 - 145 mmol/L   Potassium 3.0 (L) 3.5 - 5.1 mmol/L   Chloride 94 (L) 101 - 111 mmol/L   CO2 21 (L) 22 - 32 mmol/L   Glucose, Bld 108 (H) 65 - 99 mg/dL   BUN 20 6 - 20 mg/dL   Creatinine, Ser 1.61 0.44 - 1.00 mg/dL   Calcium 9.3 8.9 - 09.6 mg/dL   Total Protein 9.2 (H) 6.5 - 8.1 g/dL   Albumin 4.4 3.5 - 5.0 g/dL   AST 17 15 - 41 U/L   ALT 11 (L) 14 - 54 U/L   Alkaline Phosphatase 66 38 - 126 U/L   Total Bilirubin 1.3 (H) 0.3 - 1.2 mg/dL   GFR calc non Af Amer >60 >60 mL/min   GFR  calc Af Amer >60 >60 mL/min   Anion gap 16 (H) 5 - 15  CBC     Status: Abnormal   Collection Time: 09/14/17 10:32 AM  Result Value Ref Range   WBC 16.0 (H) 3.6 - 11.0 K/uL   RBC 5.17 3.80 - 5.20 MIL/uL   Hemoglobin 15.0 12.0 - 16.0 g/dL   HCT 04.544.6 40.935.0 - 81.147.0 %   MCV 86.3 80.0 - 100.0 fL   MCH 29.0 26.0 - 34.0 pg   MCHC 33.6 32.0 - 36.0 g/dL   RDW 91.415.5 (H) 78.211.5 - 95.614.5 %   Platelets 392 150 - 440 K/uL  Urinalysis, Complete w Microscopic     Status: Abnormal   Collection Time: 09/14/17 12:46 PM  Result Value Ref Range   Color, Urine YELLOW (A) YELLOW   APPearance HAZY (A) CLEAR   Specific Gravity, Urine 1.030 1.005 - 1.030   pH 6.0 5.0 - 8.0   Glucose, UA NEGATIVE NEGATIVE mg/dL   Hgb urine dipstick NEGATIVE NEGATIVE   Bilirubin Urine NEGATIVE NEGATIVE   Ketones, ur 80 (A) NEGATIVE mg/dL   Protein, ur 213100 (A) NEGATIVE mg/dL   Nitrite NEGATIVE NEGATIVE   Leukocytes, UA TRACE (A) NEGATIVE   RBC / HPF 0-5 0 - 5 RBC/hpf   WBC, UA 6-30 0 - 5 WBC/hpf   Bacteria, UA NONE SEEN NONE SEEN   Squamous Epithelial / LPF 6-30 (A) NONE SEEN   Mucus PRESENT    Hyaline Casts, UA PRESENT      prenatal labs not done yet: ABO, Rh:   Antibody:   Rubella:   RPR:    HBsAg:    HIV:     GBS:     Assessment/Plan: Hyperemesis gravidarum , early gestation  Significant dehydration + ketones . Significant weight loss.  Hypokalemia and elevated bilirubin  Which the latter may be reflective just of her dehydration   Admit with IVF , NPO . Ob u/s in am to r/o Molar gestation .  Prenatal labs ordered Reglan , phenergan , doxylamine ,IV multivit , K+ replacement Repeat bili level in am and if still elevated consider gallbladder scan      Ihor Austinhomas J Camrin Gearheart 09/14/2017, 4:23 PM

## 2017-09-14 NOTE — ED Triage Notes (Signed)
Here for hyperemesis in pregnancy.  Has been vomiting and unable to eat last 10 days. Cannot keep water down. Reports 9 lb weight loss over last week.  Has had phenergan at home but does not give relief. Tachy in triage.  No pain.  G2P1

## 2017-09-14 NOTE — ED Notes (Signed)
Report called to leslie - going to room 349

## 2017-09-15 ENCOUNTER — Other Ambulatory Visit: Payer: Self-pay

## 2017-09-15 LAB — COMPREHENSIVE METABOLIC PANEL
ALK PHOS: 50 U/L (ref 38–126)
ALT: 8 U/L — AB (ref 14–54)
AST: 14 U/L — AB (ref 15–41)
Albumin: 3.1 g/dL — ABNORMAL LOW (ref 3.5–5.0)
Anion gap: 4 — ABNORMAL LOW (ref 5–15)
BUN: 8 mg/dL (ref 6–20)
CALCIUM: 8.2 mg/dL — AB (ref 8.9–10.3)
CHLORIDE: 104 mmol/L (ref 101–111)
CO2: 26 mmol/L (ref 22–32)
CREATININE: 0.56 mg/dL (ref 0.44–1.00)
GFR calc Af Amer: >60 mL/min (ref >60)
Glucose, Bld: 107 mg/dL — ABNORMAL HIGH (ref 65–99)
Potassium: 3.4 mmol/L — ABNORMAL LOW (ref 3.5–5.1)
SODIUM: 134 mmol/L — AB (ref 135–145)
Total Bilirubin: 1 mg/dL (ref 0.3–1.2)
Total Protein: 6.4 g/dL — ABNORMAL LOW (ref 6.5–8.1)

## 2017-09-15 MED ORDER — SODIUM CHLORIDE 0.9 % IJ SOLN
INTRAMUSCULAR | Status: AC
  Administered 2017-09-15: 10 mL
  Filled 2017-09-15: qty 10

## 2017-09-15 MED ORDER — PROMETHAZINE HCL 25 MG/ML IJ SOLN
25.0000 mg | Freq: Four times a day (QID) | INTRAMUSCULAR | Status: DC
  Administered 2017-09-15 – 2017-09-16 (×2): 25 mg via INTRAVENOUS
  Filled 2017-09-15 (×3): qty 1

## 2017-09-15 NOTE — Progress Notes (Signed)
Obstetric and Gynecology  Subjective  Pamela Robertson is a 27 y.o. female G2P1001 who presented on 09/14/2017 for hyperemesis and weight loss of 9# in the past week.      Objective   Vitals:   09/15/17 0339 09/15/17 0756  BP: 105/75 100/67  Pulse: 80 77  Resp: (!) 22 18  Temp: 98.1 F (36.7 C) 98 F (36.7 C)  SpO2: 100% 100%     Intake/Output Summary (Last 24 hours) at 09/15/2017 0926 Last data filed at 09/15/2017 0347 Gross per 24 hour  Intake 4251 ml  Output 600 ml  Net 3651 ml    General: NAD Cardiovascular: RRR, no murmurs, no R/G, S1S2 Pulmonary: CTAB, no W/R/R Abdomen: Benign. Non-tender, +BS, no guarding. Extremities: No erythema or cords, no calf tenderness, +warmth with normal peripheral pulses.  Labs: Results for orders placed or performed during the hospital encounter of 09/14/17 (from the past 24 hour(s))  Comprehensive metabolic panel     Status: Abnormal   Collection Time: 09/14/17 10:32 AM  Result Value Ref Range   Sodium 131 (L) 135 - 145 mmol/L   Potassium 3.0 (L) 3.5 - 5.1 mmol/L   Chloride 94 (L) 101 - 111 mmol/L   CO2 21 (L) 22 - 32 mmol/L   Glucose, Bld 108 (H) 65 - 99 mg/dL   BUN 20 6 - 20 mg/dL   Creatinine, Ser 1.610.75 0.44 - 1.00 mg/dL   Calcium 9.3 8.9 - 09.610.3 mg/dL   Total Protein 9.2 (H) 6.5 - 8.1 g/dL   Albumin 4.4 3.5 - 5.0 g/dL   AST 17 15 - 41 U/L   ALT 11 (L) 14 - 54 U/L   Alkaline Phosphatase 66 38 - 126 U/L   Total Bilirubin 1.3 (H) 0.3 - 1.2 mg/dL   GFR calc non Af Amer >60 >60 mL/min   GFR calc Af Amer >60 >60 mL/min   Anion gap 16 (H) 5 - 15  CBC     Status: Abnormal   Collection Time: 09/14/17 10:32 AM  Result Value Ref Range   WBC 16.0 (H) 3.6 - 11.0 K/uL   RBC 5.17 3.80 - 5.20 MIL/uL   Hemoglobin 15.0 12.0 - 16.0 g/dL   HCT 04.544.6 40.935.0 - 81.147.0 %   MCV 86.3 80.0 - 100.0 fL   MCH 29.0 26.0 - 34.0 pg   MCHC 33.6 32.0 - 36.0 g/dL   RDW 91.415.5 (H) 78.211.5 - 95.614.5 %   Platelets 392 150 - 440 K/uL  Urinalysis, Complete w  Microscopic     Status: Abnormal   Collection Time: 09/14/17 12:46 PM  Result Value Ref Range   Color, Urine YELLOW (A) YELLOW   APPearance HAZY (A) CLEAR   Specific Gravity, Urine 1.030 1.005 - 1.030   pH 6.0 5.0 - 8.0   Glucose, UA NEGATIVE NEGATIVE mg/dL   Hgb urine dipstick NEGATIVE NEGATIVE   Bilirubin Urine NEGATIVE NEGATIVE   Ketones, ur 80 (A) NEGATIVE mg/dL   Protein, ur 213100 (A) NEGATIVE mg/dL   Nitrite NEGATIVE NEGATIVE   Leukocytes, UA TRACE (A) NEGATIVE   RBC / HPF 0-5 0 - 5 RBC/hpf   WBC, UA 6-30 0 - 5 WBC/hpf   Bacteria, UA NONE SEEN NONE SEEN   Squamous Epithelial / LPF 6-30 (A) NONE SEEN   Mucus PRESENT    Hyaline Casts, UA PRESENT   Type and screen [facility]     Status: None   Collection Time: 09/14/17  5:28  PM  Result Value Ref Range   ABO/RH(D) O POS    Antibody Screen NEG    Sample Expiration      09/17/2017 Performed at [facility], [address] [place], [place] [address]   CBC     Status: Abnormal   Collection Time: 09/14/17  5:29 PM  Result Value Ref Range   WBC 12.9 (H) 3.6 - 11.0 K/uL   RBC 4.50 3.80 - 5.20 MIL/uL   Hemoglobin 12.9 12.0 - 16.0 g/dL   HCT 60.4 54.0 - 98.1 %   MCV 87.2 80.0 - 100.0 fL   MCH 28.7 26.0 - 34.0 pg   MCHC 32.9 32.0 - 36.0 g/dL   RDW 19.1 (H) 47.8 - 29.5 %   Platelets 307 150 - 440 K/uL  Differential     Status: Abnormal   Collection Time: 09/14/17  5:29 PM  Result Value Ref Range   Neutrophils Relative % 80 %   Neutro Abs 10.4 (H) 1.4 - 6.5 K/uL   Lymphocytes Relative 10 %   Lymphs Abs 1.2 1.0 - 3.6 K/uL   Monocytes Relative 9 %   Monocytes Absolute 1.2 (H) 0.2 - 0.9 K/uL   Eosinophils Relative 0 %   Eosinophils Absolute 0.0 0 - 0.7 K/uL   Basophils Relative 1 %   Basophils Absolute 0.1 0 - 0.1 K/uL  Comprehensive metabolic panel     Status: Abnormal   Collection Time: 09/15/17  5:10 AM  Result Value Ref Range   Sodium 134 (L) 135 - 145 mmol/L   Potassium 3.4 (L) 3.5 -  5.1 mmol/L   Chloride 104 101 - 111 mmol/L   CO2 26 22 - 32 mmol/L   Glucose, Bld 107 (H) 65 - 99 mg/dL   BUN 8 6 - 20 mg/dL   Creatinine, Ser 6.21 0.44 - 1.00 mg/dL   Calcium 8.2 (L) 8.9 - 10.3 mg/dL   Total Protein 6.4 (L) 6.5 - 8.1 g/dL   Albumin 3.1 (L) 3.5 - 5.0 g/dL   AST 14 (L) 15 - 41 U/L   ALT 8 (L) 14 - 54 U/L   Alkaline Phosphatase 50 38 - 126 U/L   Total Bilirubin 1.0 0.3 - 1.2 mg/dL   GFR calc non Af Amer >60 >60 mL/min   GFR calc Af Amer >60 >60 mL/min   Anion gap 4 (L) 5 - 15    Cultures: Results for orders placed or performed during the hospital encounter of 04/28/16  OB RESULT CONSOLE Group B Strep     Status: None   Collection Time: 04/02/16 12:00 AM  Result Value Ref Range Status   GBS Negative  Final  Chlamydia/NGC rt PCR ([facility] only)     Status: None   Collection Time: 04/28/16  4:38 AM  Result Value Ref Range Status   Specimen source GC/Chlam URINE, RANDOM  Final   Chlamydia Tr NOT DETECTED NOT DETECTED Final   N gonorrhoeae NOT DETECTED NOT DETECTED Final    Comment: (NOTE) 100  This methodology has not been evaluated in pregnant women or in 200  patients with a history of hysterectomy. 300 400  This methodology will not be performed on patients less than 74  years of age.     Imaging: US Ob Comp Less 14 Wks  Result Date: 09/14/2017 CLINICAL DATA:  Hyperemesis gravidarum EXAM: OBSTETRIC <14 WK ULTRASOUND TECHNIQUE: Transabdominal ultrasound was performed for evaluation of the gestation as well as the maternal uterus and adnexal  regions. Patient refused transabdominal examination COMPARISON:  None. FINDINGS: Intrauterine gestational sac: Visualized Yolk sac:  Visualized Embryo:  Visualized Cardiac Activity: Visualized Heart Rate: 149 bpm CRL:   10 mm   7 w 0 d                  [place] EDC: May 03, 2018 Subchorionic hemorrhage:  None visualized. Maternal uterus/adnexae: Cervical os is closed. Right ovary measures 2.5 x 1.5 x 2.1 cm. Left ovary measures 1.9  x 2.7 x 2.2 cm. There is no extrauterine pelvic or adnexal mass. No free pelvic fluid. IMPRESSION: Single live intrauterine gestation with estimated gestational age of [redacted] weeks. Study otherwise unremarkable. Electronically Signed   By: Bretta Bang III M.D.   On: 09/14/2017 17:24     Assessment   26 y.o. G2P1001 Hospital Day: 2   Plan   1. Continue meds for nausea/vomiting control. 2.Start clear liqs and see if pt tolerates and advance as possible. 3. If patient worsens, can add steroids. Myrtie Cruise, MSN, CNM, FNP Certified Nurse Midwife [facility]

## 2017-09-15 NOTE — Progress Notes (Signed)
Pt. Has appeared to sleep well. She awakens easily and denies c/o. She has not had any emesis. Color good, skin w7d. Cap. Refill < 3 sec. Appears comfortable and in NAD.

## 2017-09-16 LAB — COMPREHENSIVE METABOLIC PANEL
ALBUMIN: 3.3 g/dL — AB (ref 3.5–5.0)
ALT: 10 U/L — ABNORMAL LOW (ref 14–54)
ANION GAP: 6 (ref 5–15)
AST: 21 U/L (ref 15–41)
Alkaline Phosphatase: 42 U/L (ref 38–126)
BUN: <5 mg/dL — ABNORMAL LOW (ref 6–20)
CHLORIDE: 101 mmol/L (ref 101–111)
CO2: 23 mmol/L (ref 22–32)
Calcium: 8.3 mg/dL — ABNORMAL LOW (ref 8.9–10.3)
Creatinine, Ser: 0.6 mg/dL (ref 0.44–1.00)
GFR calc Af Amer: >60 mL/min (ref >60)
GFR calc non Af Amer: >60 mL/min (ref >60)
GLUCOSE: 119 mg/dL — AB (ref 65–99)
POTASSIUM: 3.4 mmol/L — AB (ref 3.5–5.1)
SODIUM: 130 mmol/L — AB (ref 135–145)
Total Bilirubin: 0.8 mg/dL (ref 0.3–1.2)
Total Protein: 6.7 g/dL (ref 6.5–8.1)

## 2017-09-16 LAB — CBC WITH DIFFERENTIAL/PLATELET
BASOS ABS: 0 10*3/uL (ref 0–0.1)
Basophils Relative: 0 %
Eosinophils Absolute: 0.1 10*3/uL (ref 0–0.7)
Eosinophils Relative: 1 %
HEMATOCRIT: 36.2 % (ref 35.0–47.0)
Hemoglobin: 12 g/dL (ref 12.0–16.0)
Lymphocytes Relative: 12 %
Lymphs Abs: 1.2 10*3/uL (ref 1.0–3.6)
MCH: 29 pg (ref 26.0–34.0)
MCHC: 33.2 g/dL (ref 32.0–36.0)
MCV: 87.3 fL (ref 80.0–100.0)
MONO ABS: 0.7 10*3/uL (ref 0.2–0.9)
MONOS PCT: 7 %
NEUTROS ABS: 8.5 10*3/uL — AB (ref 1.4–6.5)
Neutrophils Relative %: 80 %
Platelets: 276 10*3/uL (ref 150–440)
RBC: 4.14 MIL/uL (ref 3.80–5.20)
RDW: 15.7 % — AB (ref 11.5–14.5)
WBC: 10.5 10*3/uL (ref 3.6–11.0)

## 2017-09-16 LAB — CHLAMYDIA/NGC RT PCR (ARMC ONLY)
Chlamydia Tr: NOT DETECTED
N GONORRHOEAE: NOT DETECTED

## 2017-09-16 LAB — HEPATITIS B SURFACE ANTIGEN: HEP B S AG: NEGATIVE

## 2017-09-16 LAB — OB RESULTS CONSOLE GC/CHLAMYDIA: GC PROBE AMP, GENITAL: NEGATIVE

## 2017-09-16 LAB — RUBELLA SCREEN: Rubella: 1.32 index (ref >0.99)

## 2017-09-16 MED ORDER — POTASSIUM CHLORIDE 2 MEQ/ML IV SOLN
INTRAVENOUS | Status: DC
  Administered 2017-09-16: 10:00:00 via INTRAVENOUS
  Filled 2017-09-16 (×4): qty 1000

## 2017-09-16 MED ORDER — PROMETHAZINE HCL 25 MG PO TABS
25.0000 mg | ORAL_TABLET | Freq: Four times a day (QID) | ORAL | Status: DC
  Administered 2017-09-16 – 2017-09-17 (×2): 25 mg via ORAL
  Filled 2017-09-16 (×3): qty 1

## 2017-09-16 MED ORDER — VITAMIN B-6 50 MG PO TABS
50.0000 mg | ORAL_TABLET | Freq: Two times a day (BID) | ORAL | Status: DC
  Administered 2017-09-16 – 2017-09-17 (×3): 50 mg via ORAL
  Filled 2017-09-16 (×4): qty 1

## 2017-09-16 MED ORDER — METOCLOPRAMIDE HCL 10 MG PO TABS
10.0000 mg | ORAL_TABLET | Freq: Three times a day (TID) | ORAL | Status: DC
  Administered 2017-09-16 – 2017-09-17 (×3): 10 mg via ORAL
  Filled 2017-09-16 (×3): qty 1

## 2017-09-16 MED ORDER — PROMETHAZINE HCL 25 MG PO TABS
25.0000 mg | ORAL_TABLET | Freq: Four times a day (QID) | ORAL | Status: DC
  Administered 2017-09-16: 25 mg via ORAL
  Filled 2017-09-16 (×2): qty 1

## 2017-09-16 NOTE — Plan of Care (Signed)
Vs stable; up ad lib; emesis once (around 0020) pt says "I think I was doing too much moving around"; clear liquid diet

## 2017-09-16 NOTE — Discharge Summary (Signed)
Patient ID: Pamela Robertson MRN: [id number] DOB/AGE: [birth year] 27 y.o.  Admit date: 09/14/2017 Discharge date: 09/17/2017  Admission Diagnoses:  1. Hyperemesis gravidarum     Discharge Diagnoses: Hyperemesis Gravidarum  Prenatal Procedures: ultrasound, IV hydration, IV antiemetics with transition to PO medications.   Consults: None  OB History: G1- 04/28/16- LTCS by Felicita Gage, infant female 9#1oz; preg c/b GHTN, obesity, low PAPP-A  Significant Diagnostic Studies:  Results for orders placed or performed during the hospital encounter of 09/14/17 (from the past 168 hour(s))  Comprehensive metabolic panel   Collection Time: 09/14/17 10:32 AM  Result Value Ref Range   Sodium 131 (L) 135 - 145 mmol/L   Potassium 3.0 (L) 3.5 - 5.1 mmol/L   Chloride 94 (L) 101 - 111 mmol/L   CO2 21 (L) 22 - 32 mmol/L   Glucose, Bld 108 (H) 65 - 99 mg/dL   BUN 20 6 - 20 mg/dL   Creatinine, Ser 8.29 0.44 - 1.00 mg/dL   Calcium 9.3 8.9 - 56.2 mg/dL   Total Protein 9.2 (H) 6.5 - 8.1 g/dL   Albumin 4.4 3.5 - 5.0 g/dL   AST 17 15 - 41 U/L   ALT 11 (L) 14 - 54 U/L   Alkaline Phosphatase 66 38 - 126 U/L   Total Bilirubin 1.3 (H) 0.3 - 1.2 mg/dL   GFR calc non Af Amer >60 >60 mL/min   GFR calc Af Amer >60 >60 mL/min   Anion gap 16 (H) 5 - 15  CBC   Collection Time: 09/14/17 10:32 AM  Result Value Ref Range   WBC 16.0 (H) 3.6 - 11.0 K/uL   RBC 5.17 3.80 - 5.20 MIL/uL   Hemoglobin 15.0 12.0 - 16.0 g/dL   HCT 13.0 86.5 - 78.4 %   MCV 86.3 80.0 - 100.0 fL   MCH 29.0 26.0 - 34.0 pg   MCHC 33.6 32.0 - 36.0 g/dL   RDW 69.6 (H) 29.5 - 28.4 %   Platelets 392 150 - 440 K/uL  Urinalysis, Complete w Microscopic   Collection Time: 09/14/17 12:46 PM  Result Value Ref Range   Color, Urine YELLOW (A) YELLOW   APPearance HAZY (A) CLEAR   Specific Gravity, Urine 1.030 1.005 - 1.030   pH 6.0 5.0 - 8.0   Glucose, UA NEGATIVE NEGATIVE mg/dL   Hgb urine dipstick NEGATIVE NEGATIVE   Bilirubin Urine NEGATIVE NEGATIVE    Ketones, ur 80 (A) NEGATIVE mg/dL   Protein, ur 132 (A) NEGATIVE mg/dL   Nitrite NEGATIVE NEGATIVE   Leukocytes, UA TRACE (A) NEGATIVE   RBC / HPF 0-5 0 - 5 RBC/hpf   WBC, UA 6-30 0 - 5 WBC/hpf   Bacteria, UA NONE SEEN NONE SEEN   Squamous Epithelial / LPF 6-30 (A) NONE SEEN   Mucus PRESENT    Hyaline Casts, UA PRESENT   Hepatitis B surface antigen   Collection Time: 09/14/17  5:28 PM  Result Value Ref Range   Hepatitis B Surface Ag Negative Negative  Type and screen [facility]   Collection Time: 09/14/17  5:28 PM  Result Value Ref Range   ABO/RH(D) O POS    Antibody Screen NEG    Sample Expiration      09/17/2017 Performed at [facility], [address] [place], [place] [address]   Rubella screen   Collection Time: 09/14/17  5:29 PM  Result Value Ref Range   Rubella 1.32 Immune >0.99 index  RPR   Collection  Time: 09/14/17  5:29 PM  Result Value Ref Range   RPR Ser Ql Non Reactive Non Reactive  CBC   Collection Time: 09/14/17  5:29 PM  Result Value Ref Range   WBC 12.9 (H) 3.6 - 11.0 K/uL   RBC 4.50 3.80 - 5.20 MIL/uL   Hemoglobin 12.9 12.0 - 16.0 g/dL   HCT 60.4 54.0 - 98.1 %   MCV 87.2 80.0 - 100.0 fL   MCH 28.7 26.0 - 34.0 pg   MCHC 32.9 32.0 - 36.0 g/dL   RDW 19.1 (H) 47.8 - 29.5 %   Platelets 307 150 - 440 K/uL  Differential   Collection Time: 09/14/17  5:29 PM  Result Value Ref Range   Neutrophils Relative % 80 %   Neutro Abs 10.4 (H) 1.4 - 6.5 K/uL   Lymphocytes Relative 10 %   Lymphs Abs 1.2 1.0 - 3.6 K/uL   Monocytes Relative 9 %   Monocytes Absolute 1.2 (H) 0.2 - 0.9 K/uL   Eosinophils Relative 0 %   Eosinophils Absolute 0.0 0 - 0.7 K/uL   Basophils Relative 1 %   Basophils Absolute 0.1 0 - 0.1 K/uL  Comprehensive metabolic panel   Collection Time: 09/15/17  5:10 AM  Result Value Ref Range   Sodium 134 (L) 135 - 145 mmol/L   Potassium 3.4 (L) 3.5 - 5.1 mmol/L   Chloride 104 101 - 111 mmol/L   CO2 26 22 - 32  mmol/L   Glucose, Bld 107 (H) 65 - 99 mg/dL   BUN 8 6 - 20 mg/dL   Creatinine, Ser 6.21 0.44 - 1.00 mg/dL   Calcium 8.2 (L) 8.9 - 10.3 mg/dL   Total Protein 6.4 (L) 6.5 - 8.1 g/dL   Albumin 3.1 (L) 3.5 - 5.0 g/dL   AST 14 (L) 15 - 41 U/L   ALT 8 (L) 14 - 54 U/L   Alkaline Phosphatase 50 38 - 126 U/L   Total Bilirubin 1.0 0.3 - 1.2 mg/dL   GFR calc non Af Amer >60 >60 mL/min   GFR calc Af Amer >60 >60 mL/min   Anion gap 4 (L) 5 - 15  Comprehensive metabolic panel   Collection Time: 09/16/17  9:25 AM  Result Value Ref Range   Sodium 130 (L) 135 - 145 mmol/L   Potassium 3.4 (L) 3.5 - 5.1 mmol/L   Chloride 101 101 - 111 mmol/L   CO2 23 22 - 32 mmol/L   Glucose, Bld 119 (H) 65 - 99 mg/dL   BUN <5 (L) 6 - 20 mg/dL   Creatinine, Ser 3.08 0.44 - 1.00 mg/dL   Calcium 8.3 (L) 8.9 - 10.3 mg/dL   Total Protein 6.7 6.5 - 8.1 g/dL   Albumin 3.3 (L) 3.5 - 5.0 g/dL   AST 21 15 - 41 U/L   ALT 10 (L) 14 - 54 U/L   Alkaline Phosphatase 42 38 - 126 U/L   Total Bilirubin 0.8 0.3 - 1.2 mg/dL   GFR calc non Af Amer >60 >60 mL/min   GFR calc Af Amer >60 >60 mL/min   Anion gap 6 5 - 15  CBC with Differential/Platelet   Collection Time: 09/16/17  9:25 AM  Result Value Ref Range   WBC 10.5 3.6 - 11.0 K/uL   RBC 4.14 3.80 - 5.20 MIL/uL   Hemoglobin 12.0 12.0 - 16.0 g/dL   HCT 65.7 84.6 - 96.2 %   MCV 87.3 80.0 - 100.0 fL   MCH  29.0 26.0 - 34.0 pg   MCHC 33.2 32.0 - 36.0 g/dL   RDW 16.115.7 (H) 09.611.5 - 04.514.5 %   Platelets 276 150 - 440 K/uL   Neutrophils Relative % 80 %   Neutro Abs 8.5 (H) 1.4 - 6.5 K/uL   Lymphocytes Relative 12 %   Lymphs Abs 1.2 1.0 - 3.6 K/uL   Monocytes Relative 7 %   Monocytes Absolute 0.7 0.2 - 0.9 K/uL   Eosinophils Relative 1 %   Eosinophils Absolute 0.1 0 - 0.7 K/uL   Basophils Relative 0 %   Basophils Absolute 0.0 0 - 0.1 K/uL  Chlamydia/NGC rt PCR ([facility] only)   Collection Time: 09/16/17  6:42 PM  Result Value Ref Range   Specimen source GC/Chlam CHLAMYDIA SPECIES     Chlamydia Tr NOT DETECTED NOT DETECTED   N gonorrhoeae NOT DETECTED NOT DETECTED  Basic metabolic panel   Collection Time: 09/17/17  7:23 AM  Result Value Ref Range   Sodium 134 (L) 135 - 145 mmol/L   Potassium 3.9 3.5 - 5.1 mmol/L   Chloride 104 101 - 111 mmol/L   CO2 25 22 - 32 mmol/L   Glucose, Bld 89 65 - 99 mg/dL   BUN 7 6 - 20 mg/dL   Creatinine, Ser 4.090.64 0.44 - 1.00 mg/dL   Calcium 8.6 (L) 8.9 - 10.3 mg/dL   GFR calc non Af Amer >60 >60 mL/min   GFR calc Af Amer >60 >60 mL/min   Anion gap 5 5 - 15    Treatments: IV hydration and Antiemetics.   Hospital Course:  This is a 27 y.o. G2P1001 with IUP at 436w3d admitted for Hyperemesis, with weight loss of 9lbs over 1 week, unable to keep any liquid or solids down after trying phenergan suppositories.    SIUP was confirmed with OB US transabdominal on 09/14/17, CRL 10mm, measuring 8563w0d; [facility] c/w L[facility] dating.    She was deemed stable for discharge to home with outpatient follow up.  Discharge Physical Exam:  BP 119/77 (BP Location: Right Arm)   Pulse 90   Temp 98.1 F (36.7 C) (Oral)   Resp 20   Ht 5\' 1"  (1.549 m)   Wt 103.6 kg (228 lb 4.8 oz)   SpO2 100%   BMI 43.14 kg/m   General: NAD CV: RRR Pulm: CTABL, nl effort ABD: s/nd/nt, gravid DVT Evaluation: LE non-ttp, no evidence of DVT on exam.  Prenatal Labs:  Blood type/Rh  O pos  Antibody screen neg  Rubella Immune  Varicella   pending  RPR  NR  HBsAg Neg  HIV   pending  GC  negative  Chlamydia  negative    Discharge Condition: Stable  Disposition: to home, [facility]   Allergies as of 09/17/2017   No Known Allergies     Medication List    STOP taking these medications   acetaminophen 325 MG tablet Commonly known as:  TYLENOL   ibuprofen 600 MG tablet Commonly known as:  ADVIL,MOTRIN   mometasone 0.1 % ointment Commonly known as:  ELOCON   ondansetron 4  MG disintegrating tablet Commonly known as:  ZOFRAN ODT   oxyCODONE-acetaminophen 5-325 MG tablet Commonly known as:  ROXICET     TAKE these medications   calcium carbonate 500 MG chewable tablet Commonly known as:  TUMS - dosed in mg elemental calcium Chew 2 tablets (  400 mg of elemental calcium total) by mouth every 4 (four) hours as needed for indigestion.   doxylamine (Sleep) 25 MG tablet Commonly known as:  UNISOM Take 1 tablet (25 mg total) by mouth 2 (two) times daily.   flintstones complete 60 MG chewable tablet Chew 2 tablets by mouth daily.   metoCLOPramide 10 MG tablet Commonly known as:  REGLAN Take 1 tablet (10 mg total) by mouth every 8 (eight) hours.   promethazine 25 MG suppository Commonly known as:  PHENERGAN Place 25 mg rectally every 6 (six) hours as needed for nausea or vomiting. What changed:  Another medication with the same name was changed. Make sure you understand how and when to take each.   promethazine 25 MG tablet Commonly known as:  PHENERGAN Take 1 tablet (25 mg total) by mouth every 6 (six) hours. What changed:    when to take this  reasons to take this   pyridOXINE 50 MG tablet Commonly known as:  B-6 Take 1 tablet (50 mg total) by mouth 2 (two) times daily.        Signed: ----- Elenora Fender Ward, MD 09/17/17 9:09 AM

## 2017-09-16 NOTE — Progress Notes (Signed)
ANTEPARTUM PROGRESS NOTE  Pamela Robertson is a 27 y.o. G2P1001 [facility] Estimated Date of Delivery: 05/02/18 who is admitted for Hyperemesis gravidarum.  Length of Stay:  2 Days. Admitted 09/14/2017  Subjective: Pt feeling well this am, mild nausea, no vomiting since around midnight one time. Would like to advance diet this am. Tolerating clears except for beef broth. Denies LOF, VB or cramping   Vitals:  BP 115/78 (BP Location: Right Arm)   Pulse 89   Temp 98.2 F (36.8 C) (Oral)   Resp 18   Ht '5\' 1"'$  (1.549 m)   Wt 224 lb (101.6 kg)   SpO2 100%   BMI 42.32 kg/m   Physical Examination: CONSTITUTIONAL: Well-developed, well-nourished female in no acute distress.  HENT:  Normocephalic, atraumatic, External right and left ear normal. Oropharynx is clear and moist EYES: Conjunctivae normal. Pupils are equal, round, and reactive to light. No scleral icterus.  NECK: Normal range of motion, supple, no masses SKIN: Skin is warm and dry. No rash noted. Not diaphoretic. No erythema. No pallor. [place] Alert and oriented to person, place, and time. Normal reflexes, muscle tone coordination. PSYCHIATRIC: Normal mood and affect. Normal behavior. Normal judgment and thought content. CARDIOVASCULAR: Normal heart rate noted, regular rhythm RESPIRATORY: Effort and breath sounds normal, no problems with respiration noted, lungs clear to ascultation bilaterally MUSCULOSKELETAL: Normal range of motion. No edema and no tenderness. 2+ distal pulses. ABDOMEN: Soft, nontender, nondistended  Fetal monitoring: 4/14:  1st tri [facility] viable SIUP at 765w0dResults for orders placed or performed during the hospital encounter of 09/14/17 (from the past 48 hour(s))  Comprehensive metabolic panel     Status: Abnormal   Collection Time: 09/14/17 10:32 AM  Result Value Ref Range   Sodium 131 (L) 135 - 145 mmol/L   Potassium 3.0 (L) 3.5 - 5.1 mmol/L   Chloride 94 (L) 101 - 111 mmol/L   CO2 21  (L) 22 - 32 mmol/L   Glucose, Bld 108 (H) 65 - 99 mg/dL   BUN 20 6 - 20 mg/dL   Creatinine, Ser 0.75 0.44 - 1.00 mg/dL   Calcium 9.3 8.9 - 10.3 mg/dL   Total Protein 9.2 (H) 6.5 - 8.1 g/dL   Albumin 4.4 3.5 - 5.0 g/dL   AST 17 15 - 41 U/L   ALT 11 (L) 14 - 54 U/L   Alkaline Phosphatase 66 38 - 126 U/L   Total Bilirubin 1.3 (H) 0.3 - 1.2 mg/dL   GFR calc non Af Amer >60 >60 mL/min   GFR calc Af Amer >60 >60 mL/min    Comment: (NOTE) The eGFR has been calculated using the CKD EPI equation. This calculation has not been validated in all clinical situations. eGFR's persistently <60 mL/min signify possible Chronic Kidney Disease.    Anion gap 16 (H) 5 - 15    Comment: [facility] [id number]     Status: Abnormal   Collection Time: 09/14/17 10:32 AM  Result Value Ref Range   WBC 16.0 (H) 3.6 - 11.0 K/uL   RBC 5.17 3.80 - 5.20 MIL/uL   Hemoglobin 15.0 12.0 - 16.0 g/dL   HCT 44.6 35.0 - 47.0 %   MCV 86.3 80.0 - 100.0 fL   MCH 29.0 26.0 - 34.0 pg   MCHC 33.6 32.0 - 36.0 g/dL   RDW 15.5 (H) 11.5 - 14.5 %   Platelets 392  150 - 440 K/uL    Comment: Performed at [facility], [address] [place], [place] [address]  Urinalysis, Complete w Microscopic     Status: Abnormal   Collection Time: 09/14/17 12:46 PM  Result Value Ref Range   Color, Urine YELLOW (A) YELLOW   APPearance HAZY (A) CLEAR   Specific Gravity, Urine 1.030 1.005 - 1.030   pH 6.0 5.0 - 8.0   Glucose, UA NEGATIVE NEGATIVE mg/dL   Hgb urine dipstick NEGATIVE NEGATIVE   Bilirubin Urine NEGATIVE NEGATIVE   Ketones, ur 80 (A) NEGATIVE mg/dL   Protein, ur 100 (A) NEGATIVE mg/dL   Nitrite NEGATIVE NEGATIVE   Leukocytes, UA TRACE (A) NEGATIVE   RBC / HPF 0-5 0 - 5 RBC/hpf   WBC, UA 6-30 0 - 5 WBC/hpf   Bacteria, UA NONE SEEN NONE SEEN   Squamous Epithelial / LPF 6-30 (A) NONE SEEN   Mucus PRESENT    Hyaline Casts, UA PRESENT     Comment:  Performed at [facility], [address] [place], [place] [address]  Type and screen Martin     Status: None   Collection Time: 09/14/17  5:28 PM  Result Value Ref Range   ABO/RH(D) O POS    Antibody Screen NEG    Sample Expiration      09/17/2017 Performed at [facility], [address] [place], [place] [address]   Hepatitis B surface antigen     Status: None   Collection Time: 09/14/17  5:28 PM  Result Value Ref Range   Hepatitis B Surface Ag Negative Negative    Comment: (NOTE) Performed At: [facility] [place], [place] [id number] Rush Farmer MD [phone] Performed at [facility], [address] [place], [place] [address]   Rubella screen     Status: None   Collection Time: 09/14/17  5:29 PM  Result Value Ref Range   Rubella 1.32 Immune >0.99 index    Comment: (NOTE)                                Non-immune       <0.90                                Equivocal  0.90 - 0.99                                Immune           >0.99 Performed At: [facility] [address] [place], [place] [id number] Rush Farmer MD [phone] Performed at [facility], [address] [place], [place] [address]   CBC     Status: Abnormal   Collection Time: 09/14/17  5:29 PM  Result Value Ref Range   WBC 12.9 (H) 3.6 - 11.0 K/uL   RBC 4.50 3.80 - 5.20 MIL/uL   Hemoglobin 12.9 12.0 - 16.0 g/dL   HCT 39.2 35.0 - 47.0 %   MCV 87.2 80.0 - 100.0 fL   MCH 28.7 26.0 - 34.0 pg   MCHC 32.9 32.0 - 36.0 g/dL   RDW 15.9 (H) 11.5 - 14.5 %   Platelets 307 150 - 440 K/uL    Comment: Performed at [facility], [address] [place], [place] [address]  Differential     Status: Abnormal   Collection Time: 09/14/17  5:29 PM  Result Value Ref Range   Neutrophils Relative % 80 %   Neutro Abs 10.4 (H) 1.4 - 6.5 K/uL   Lymphocytes Relative 10 %   Lymphs Abs 1.2 1.0 - 3.6 K/uL    Monocytes Relative 9 %   Monocytes Absolute 1.2 (H) 0.2 - 0.9 K/uL   Eosinophils Relative 0 %   Eosinophils Absolute 0.0 0 - 0.7 K/uL   Basophils Relative 1 %   Basophils Absolute 0.1 0 - 0.1 K/uL    Comment: Performed at [facility], [address] [place], [place] [address]  Comprehensive metabolic panel     Status: Abnormal   Collection Time: 09/15/17  5:10 AM  Result Value Ref Range   Sodium 134 (L) 135 - 145 mmol/L   Potassium 3.4 (L) 3.5 - 5.1 mmol/L   Chloride 104 101 - 111 mmol/L   CO2 26 22 - 32 mmol/L   Glucose, Bld 107 (H) 65 - 99 mg/dL   BUN 8 6 - 20 mg/dL   Creatinine, Ser 0.56 0.44 - 1.00 mg/dL   Calcium 8.2 (L) 8.9 - 10.3 mg/dL   Total Protein 6.4 (L) 6.5 - 8.1 g/dL   Albumin 3.1 (L) 3.5 - 5.0 g/dL   AST 14 (L) 15 - 41 U/L   ALT 8 (L) 14 - 54 U/L   Alkaline Phosphatase 50 38 - 126 U/L   Total Bilirubin 1.0 0.3 - 1.2 mg/dL   GFR calc non Af Amer >60 >60 mL/min   GFR calc Af Amer >60 >60 mL/min    Comment: (NOTE) The eGFR has been calculated using the CKD EPI equation. This calculation has not been validated in all clinical situations. eGFR's persistently <60 mL/min signify possible Chronic Kidney Disease.    Anion gap 4 (L) 5 - 15    Comment: Performed at [facility], [address] [place], [place] [address]    US Ob Comp Less 14 Wks  Result Date: 09/14/2017 CLINICAL DATA:  Hyperemesis gravidarum EXAM: OBSTETRIC <14 WK ULTRASOUND TECHNIQUE: Transabdominal ultrasound was performed for evaluation of the gestation as well as the maternal uterus and adnexal regions. Patient refused transabdominal examination COMPARISON:  None. FINDINGS: Intrauterine gestational sac: Visualized Yolk sac:  Visualized Embryo:  Visualized Cardiac Activity: Visualized Heart Rate: 149 bpm CRL:   10 mm   7 w 0 d                  [place] EDC: May 03, 2018 Subchorionic hemorrhage:  None visualized. Maternal uterus/adnexae: Cervical os is closed. Right ovary measures 2.5  x 1.5 x 2.1 cm. Left ovary measures 1.9 x 2.7 x 2.2 cm. There is no extrauterine pelvic or adnexal mass. No free pelvic fluid. IMPRESSION: Single live intrauterine gestation with estimated gestational age of [redacted] weeks. Study otherwise unremarkable. Electronically Signed   By: Lowella Grip III M.D.   On: 09/14/2017 17:24    Current scheduled medications . docusate sodium  100 mg Oral Daily  . doxylamine (Sleep)  25 mg Oral BID  . metoCLOPramide (REGLAN) injection  10 mg Intravenous Q8H  . prenatal multivitamin  1 tablet Oral Q1200  . promethazine  25 mg Intravenous QID    I have reviewed the patient's current medications.  ASSESSMENT: Patient Active Problem List   Diagnosis Date Noted  . Hyperemesis gravidarum 09/14/2017  . Post-operative state  04/29/2016  . Pregnancy 04/28/2016  . Uterine contractions during pregnancy 04/28/2016  . Family history of neurofibromatosis, type 1 (von Recklinghausen's disease)   . 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 Bipolar I disorder, most recent episode (or current) mixed, moderate 10/06/2012  . Unspecified gastritis and gastroduodenitis without mention of hemorrhage 09/06/2012  . Routine general medical examination at a health care facility 09/04/2012  . Obesity (BMI 30-39.9) 12/23/2011  . 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 Depression   . Menstrual irregularity     PLAN:  - Continue meds for nausea/vomiting control. - Advance to full liqs today - CBC and CMP this am- pending - C/w Dr Ouida Sills prn - Continue routine antenatal care.   Deon Ivey, [place], CNM 09/16/2017  8:51 AM

## 2017-09-16 NOTE — Progress Notes (Addendum)
ANTEPARTUM PROGRESS NOTE  Pamela Robertson is a 27 y.o. G2P1001 [facility] 96[facility]3d[facility] Est[facility] Date of Delivery: 05/02/18 who is admitted for Hyperemesis gravidarum.    Length of Stay:  2 Days. Admitted 09/14/2017  Subjective: Pt tolerated full liquid diet for breakfast and lunch with no vomiting, reports minimal nausea now. Tolerated PO meds without problems. Would like to shower.   Vitals:  BP 100/63 (BP Location: Left Arm)   Pulse 97   Temp 98.2 F (36.8 C) (Oral)   Resp 16   Ht 5' 1" (1.549 m)   Wt 224 lb (101.6 kg)   SpO2 100%   BMI 42.32 kg/m   Physical Examination: CONSTITUTIONAL: Well-developed, well-nourished female in no acute distress, resting quietly in bed.  HENT:  Normocephalic, atraumatic.  SKIN: Skin is warm and dry. No rash noted. Not diaphoretic. No erythema. No pallor. [place] Alert and oriented to person, place, and time.  PSYCHIATRIC: Normal mood and affect. Normal behavior. Normal judgment and thought content. CARDIOVASCULAR: Normal heart rate noted, regular rhythm ABDOMEN: Soft, nontender, nondistended   Results for orders placed or performed during the hospital encounter of 09/14/17 (from the past 48 hour(s))  Comprehensive metabolic panel     Status: Abnormal   Collection Time: 09/15/17  5:10 AM  Result Value Ref Range   Sodium 134 (L) 135 - 145 mmol/L   Potassium 3.4 (L) 3.5 - 5.1 mmol/L   Chloride 104 101 - 111 mmol/L   CO2 26 22 - 32 mmol/L   Glucose, Bld 107 (H) 65 - 99 mg/dL   BUN 8 6 - 20 mg/dL   Creatinine, Ser 0.56 0.44 - 1.00 mg/dL   Calcium 8.2 (L) 8.9 - 10.3 mg/dL   Total Protein 6.4 (L) 6.5 - 8.1 g/dL   Albumin 3.1 (L) 3.5 - 5.0 g/dL   AST 14 (L) 15 - 41 U/L   ALT 8 (L) 14 - 54 U/L   Alkaline Phosphatase 50 38 - 126 U/L   Total Bilirubin 1.0 0.3 - 1.2 mg/dL   GFR calc non Af Amer >60 >60 mL/min   GFR calc Af Amer >60 >60 mL/min    Comment: (NOTE) The eGFR has been calculated using the CKD EPI equation. This calculation has  not been validated in all clinical situations. eGFR's persistently <60 mL/min signify possible Chronic Kidney Disease.    Anion gap 4 (L) 5 - 15    Comment: [facility]formed at [facility] [address] [id number] Comprehensive metabolic panel     Status: Abnormal   Collection Time: 09/16/17  9:25 AM  Result Value Ref Range   Sodium 130 (L) 135 - 145 mmol/L   Potassium 3.4 (L) 3.5 - 5.1 mmol/L   Chloride 101 101 - 111 mmol/L   CO2 23 22 - 32 mmol/L   Glucose, Bld 119 (H) 65 - 99 mg/dL   BUN <5 (L) 6 - 20 mg/dL   Creatinine, Ser 0.60 0.44 - 1.00 mg/dL   Calcium 8.3 (L) 8.9 - 10.3 mg/dL   Total Protein 6.7 6.5 - 8.1 g/dL   Albumin 3.3 (L) 3.5 - 5.0 g/dL   AST 21 15 - 41 U/L   ALT 10 (L) 14 - 54 U/L   Alkaline Phosphatase 42 38 - 126 U/L   Total Bilirubin 0.8 0.3 - 1.2 mg/dL   GFR calc non Af Amer >60 >60 mL/min   GFR calc Af Amer >60 >60 mL/min  Comment: (NOTE) The eGFR has been calculated using the CKD EPI equation. This calculation has not been validated in all clinical situations. eGFR's persistently <60 mL/min signify possible Chronic Kidney Disease.    Anion gap 6 5 - 15    Comment: Performed at [facility], [address] [place], [place] [address]  CBC with Differential/Platelet     Status: Abnormal   Collection Time: 09/16/17  9:25 AM  Result Value Ref Range   WBC 10.5 3.6 - 11.0 K/uL   RBC 4.14 3.80 - 5.20 MIL/uL   Hemoglobin 12.0 12.0 - 16.0 g/dL   HCT 36.2 35.0 - 47.0 %   MCV 87.3 80.0 - 100.0 fL   MCH 29.0 26.0 - 34.0 pg   MCHC 33.2 32.0 - 36.0 g/dL   RDW 15.7 (H) 11.5 - 14.5 %   Platelets 276 150 - 440 K/uL   Neutrophils Relative % 80 %   Neutro Abs 8.5 (H) 1.4 - 6.5 K/uL   Lymphocytes Relative 12 %   Lymphs Abs 1.2 1.0 - 3.6 K/uL   Monocytes Relative 7 %   Monocytes Absolute 0.7 0.2 - 0.9 K/uL   Eosinophils Relative 1 %   Eosinophils Absolute 0.1 0 - 0.7 K/uL   Basophils Relative 0 %   Basophils Absolute 0.0 0 -  0.1 K/uL    Comment: Performed at [facility], [address] [place],  [address]    No results found.  Current scheduled medications . docusate sodium  100 mg Oral Daily  . doxylamine (Sleep)  25 mg Oral BID  . metoCLOPramide  10 mg Oral Q8H  . prenatal multivitamin  1 tablet Oral Q1200  . promethazine  25 mg Oral Q6H  . vitamin B-6  50 mg Oral BID    I have reviewed the patient's current medications.  ASSESSMENT: Patient Active Problem List   Diagnosis Date Noted  . Hyperemesis gravidarum 09/14/2017  . Post-operative state 04/29/2016  . Pregnancy 04/28/2016  . Uterine contractions during pregnancy 04/28/2016  . Family history of neurofibromatosis, type 1 (von Recklinghausen's disease)   . 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 Bipolar I disorder, most recent episode (or current) mixed, moderate 10/06/2012  . Unspecified gastritis and gastroduodenitis without mention of hemorrhage 09/06/2012  . Routine general medical examination at a health care facility 09/04/2012  . Obesity (BMI 30-39.9) 12/23/2011  . 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 Depression   . Menstrual irregularity     PLAN: -Reviewed AM labs, sodium low, will decrease IV fluids now to 75cc/hr. And consider decreasing to [facility] after 4hrs if tolerating regular diet. Plan for BMP in AM.  -Regular diet now, reviewed to keep bland diet, avoid fried/spicy foods.  -Consider DC in Am.  -Continue routine antenatal care- urine GC/CT ordered and Varicella/HIV added on to previous lab draw for completion of Prenatal lab panel.     [place], [place], CNM 09/16/2017  5:43 PM

## 2017-09-17 LAB — BASIC METABOLIC PANEL
Anion gap: 5 (ref 5–15)
BUN: 7 mg/dL (ref 6–20)
CALCIUM: 8.6 mg/dL — AB (ref 8.9–10.3)
CO2: 25 mmol/L (ref 22–32)
Chloride: 104 mmol/L (ref 101–111)
Creatinine, Ser: 0.64 mg/dL (ref 0.44–1.00)
GFR calc Af Amer: >60 mL/min (ref >60)
GLUCOSE: 89 mg/dL (ref 65–99)
Potassium: 3.9 mmol/L (ref 3.5–5.1)
SODIUM: 134 mmol/L — AB (ref 135–145)

## 2017-09-17 LAB — RPR: RPR Ser Ql: NONREACTIVE

## 2017-09-17 MED ORDER — DOXYLAMINE SUCCINATE (SLEEP) 25 MG PO TABS: 25.0000 mg | ORAL_TABLET | Freq: Two times a day (BID) | ORAL | 0 refills | Status: DC

## 2017-09-17 MED ORDER — CALCIUM CARBONATE ANTACID 500 MG PO CHEW: 2.0000 | CHEWABLE_TABLET | ORAL | Status: DC | PRN

## 2017-09-17 MED ORDER — METOCLOPRAMIDE HCL 10 MG PO TABS: 10.0000 mg | ORAL_TABLET | Freq: Three times a day (TID) | ORAL | 3 refills | Status: DC

## 2017-09-17 MED ORDER — PROMETHAZINE HCL 25 MG PO TABS: 25.0000 mg | ORAL_TABLET | Freq: Four times a day (QID) | ORAL | 3 refills | Status: DC

## 2017-09-17 MED ORDER — PROMETHAZINE HCL 25 MG PO TABS
25.0000 mg | ORAL_TABLET | Freq: Four times a day (QID) | ORAL | Status: DC
  Administered 2017-09-17: 25 mg via ORAL

## 2017-09-17 MED ORDER — PYRIDOXINE HCL 50 MG PO TABS: 50.0000 mg | ORAL_TABLET | Freq: Two times a day (BID) | ORAL | 3 refills | Status: DC

## 2017-09-17 NOTE — Discharge Instructions (Signed)
Eating Plan for Hyperemesis Gravidarum °Hyperemesis gravidarum is a severe form of morning sickness. Because this condition causes severe nausea and vomiting, it can lead to dehydration, malnutrition, and weight loss. One way to lessen the symptoms of nausea and vomiting is to follow the eating plan for hyperemesis gravidarum. It is often used along with prescribed medicines to control your symptoms. °What can I do to relieve my symptoms? °Listen to your body. Everyone is different and has different preferences. Find what works best for you. Take any of the following actions that are helpful to you: °· Eat and drink slowly. °· Eat 5-6 small meals daily instead of 3 large meals. °· Eat crackers before you get out of bed in the morning. °· Try having a snack in the middle of the night. °· Starchy foods are usually tolerated well. Examples include cereal, toast, bread, potatoes, pasta, rice, and pretzels. °· Ginger may help with nausea. Add ¼ tsp ground ginger to hot tea or choose ginger tea. °· Try drinking 100% fruit juice or an electrolyte drink. An electrolyte drink contains sodium, potassium, and chloride. °· Continue to take your prenatal vitamins as told by your health care provider. If you are having trouble taking your prenatal vitamins, talk with your health care provider about different options. °· Include at least 1 serving of protein with your meals and snacks. Protein options include meats or poultry, beans, nuts, eggs, and yogurt. Try eating a protein-rich snack before bed. Examples of these snacks include cheese and crackers or half of a peanut butter or turkey sandwich. °· Consider eliminating foods that trigger your symptoms. These may include spicy foods, coffee, high-fat foods, very sweet foods, and acidic foods. °· Try meals that have more protein combined with bland, salty, lower-fat, and dry foods, such as nuts, seeds, pretzels, crackers, and cereal. °· Talk with your healthcare provider about  starting a supplement of vitamin B6. °· Have fluids that are cold, clear, and carbonated or sour. Examples include lemonade, ginger ale, lemon-lime soda, ice water, and sparkling water. °· Try lemon or mint tea. °· Try brushing your teeth or using a mouth rinse after meals. ° °What should I avoid to reduce my symptoms? °Avoiding some of the following things may help reduce your symptoms. °· Foods with strong smells. Try eating meals in well-ventilated areas that are free of odors. °· Drinking water or other beverages with meals. Try not to drink anything during the 30 minutes before and after your meals. °· Drinking more than 1 cup of fluid at a time. Sometimes using a straw helps. °· Fried or high-fat foods, such as butter and cream sauces. °· Spicy foods. °· Skipping meals as best as you can. Nausea can be more intense on an empty stomach. If you cannot tolerate food at that time, do not force it. Try sucking on ice chips or other frozen items, and make up for missed calories later. °· Lying down within 2 hours after eating. °· Environmental triggers. These may include smoky rooms, closed spaces, rooms with strong smells, warm or humid places, overly loud and noisy rooms, and rooms with motion or flickering lights. °· Quick and sudden changes in your movement. ° °This information is not intended to replace advice given to you by your health care provider. Make sure you discuss any questions you have with your health care provider. °Document Released: 03/17/2007 Document Revised: 01/17/2016 Document Reviewed: 12/19/2015 °Elsevier Interactive Patient Education © 2018 [employer] ° °

## 2017-09-17 NOTE — Progress Notes (Signed)
Discharge inst reviewed with pt.  Verb u/o 

## 2017-09-17 NOTE — Progress Notes (Signed)
Discharged to home; ambulatory to car. 

## 2017-09-17 NOTE — Plan of Care (Signed)
Vs stable; up ad lib; tolerating regular diet with no emesis this shift; tolerating PO meds now

## 2017-09-18 LAB — VARICELLA ZOSTER ANTIBODY, IGG: Varicella IgG: 917 index (ref >165)

## 2017-09-18 LAB — HIV ANTIBODY (ROUTINE TESTING W REFLEX): HIV SCREEN 4TH GENERATION: NONREACTIVE

## 2017-10-10 ENCOUNTER — Other Ambulatory Visit: Payer: Self-pay | Admitting: Certified Nurse Midwife

## 2017-10-10 DIAGNOSIS — Z369 Encounter for antenatal screening, unspecified: Secondary | ICD-10-CM

## 2017-10-23 ENCOUNTER — Ambulatory Visit
Admission: RE | Admit: 2017-10-23 | Discharge: 2017-10-23 | Disposition: A | Discharge status: Home / Self Care | Payer: Medicaid Other | Source: Ambulatory Visit | Attending: Maternal & Fetal Medicine | Admitting: Maternal & Fetal Medicine

## 2017-10-23 ENCOUNTER — Ambulatory Visit: Payer: Medicaid Other

## 2017-10-23 DIAGNOSIS — Z3A13 13 weeks gestation of pregnancy: Secondary | ICD-10-CM | POA: Insufficient documentation

## 2017-10-23 DIAGNOSIS — Z3401 Encounter for supervision of normal first pregnancy, first trimester: Secondary | ICD-10-CM | POA: Diagnosis not present

## 2017-10-23 DIAGNOSIS — Z369 Encounter for antenatal screening, unspecified: Secondary | ICD-10-CM

## 2017-10-23 DIAGNOSIS — Z3689 Encounter for other specified antenatal screening: Secondary | ICD-10-CM | POA: Diagnosis present

## 2017-10-23 DIAGNOSIS — Z8489 Family history of other specified conditions: Secondary | ICD-10-CM | POA: Insufficient documentation

## 2017-10-30 ENCOUNTER — Telehealth: Payer: Self-pay | Admitting: Obstetrics and Gynecology

## 2017-10-30 NOTE — Telephone Encounter (Signed)
   Ms. Pamela Robertson elected to undergo First Trimester screening as a part of her routine prenatal care on 10/23/2017.  To review, first trimester screening, includes nuchal translucency ultrasound screen and/or first trimester maternal serum marker screening.  The nuchal translucency has approximately an 80% detection rate for Down syndrome and can be positive for other chromosome abnormalities as well as heart defects.  When combined with a maternal serum marker screening, the detection rate is up to 90% for Down syndrome and up to 97% for trisomy 13 and 18.     The results of the First Trimester Nuchal Translucency and Biochemical Screening were within normal range for aneuploidy.  The risk for Down syndrome is now estimated to be 1 in 1,655.  The risk for Trisomy 13/18 is 1 in >10,000.  Should more definitive information be desired, we would offer amniocentesis.  Because we do not yet know the effectiveness of combined first and second trimester screening, we do not recommend a maternal serum screen to assess the chance for chromosome conditions.  However, if screening for neural tube defects is desired, maternal serum screening for AFP only can be performed between 15 and [redacted] weeks gestation.    Of note, the PAPP-A level was at then 5th percentile.  PAPP-A results at or below the 5th percentile have been associated with and increased chance for adverse obstetrical outcomes, including preeclampsia.  For this reason, a third trimester ultrasound for growth could be considered.    Cherly Anderson, MS, CGC

## 2017-11-24 ENCOUNTER — Other Ambulatory Visit: Payer: Self-pay | Admitting: *Deleted

## 2017-11-24 DIAGNOSIS — E669 Obesity, unspecified: Secondary | ICD-10-CM

## 2017-11-27 ENCOUNTER — Ambulatory Visit
Admission: RE | Admit: 2017-11-27 | Discharge: 2017-11-27 | Disposition: A | Discharge status: Home / Self Care | Payer: Medicaid Other | Source: Ambulatory Visit | Attending: Maternal & Fetal Medicine | Admitting: Maternal & Fetal Medicine

## 2017-11-27 DIAGNOSIS — Z3A18 18 weeks gestation of pregnancy: Secondary | ICD-10-CM | POA: Insufficient documentation

## 2017-11-27 DIAGNOSIS — O99212 Obesity complicating pregnancy, second trimester: Secondary | ICD-10-CM | POA: Insufficient documentation

## 2017-11-27 DIAGNOSIS — E669 Obesity, unspecified: Secondary | ICD-10-CM

## 2017-11-27 DIAGNOSIS — Z3689 Encounter for other specified antenatal screening: Secondary | ICD-10-CM | POA: Insufficient documentation

## 2018-01-19 ENCOUNTER — Emergency Department
Admission: EM | Admit: 2018-01-19 | Discharge: 2018-01-19 | Disposition: A | Discharge status: Home / Self Care | Payer: Medicaid Other | Attending: Emergency Medicine | Admitting: Emergency Medicine

## 2018-01-19 ENCOUNTER — Other Ambulatory Visit: Payer: Self-pay

## 2018-01-19 ENCOUNTER — Encounter: Payer: Self-pay | Admitting: Emergency Medicine

## 2018-01-19 DIAGNOSIS — Y998 Other external cause status: Secondary | ICD-10-CM | POA: Diagnosis not present

## 2018-01-19 DIAGNOSIS — S161XXA Strain of muscle, fascia and tendon at neck level, initial encounter: Secondary | ICD-10-CM | POA: Diagnosis not present

## 2018-01-19 DIAGNOSIS — Z79899 Other long term (current) drug therapy: Secondary | ICD-10-CM | POA: Insufficient documentation

## 2018-01-19 DIAGNOSIS — Y939 Activity, unspecified: Secondary | ICD-10-CM | POA: Insufficient documentation

## 2018-01-19 DIAGNOSIS — Z87891 Personal history of nicotine dependence: Secondary | ICD-10-CM | POA: Diagnosis not present

## 2018-01-19 DIAGNOSIS — S199XXA Unspecified injury of neck, initial encounter: Secondary | ICD-10-CM | POA: Diagnosis present

## 2018-01-19 DIAGNOSIS — Y92410 Unspecified street and highway as the place of occurrence of the external cause: Secondary | ICD-10-CM | POA: Insufficient documentation

## 2018-01-19 MED ORDER — ACETAMINOPHEN 325 MG PO TABS
650.0000 mg | ORAL_TABLET | Freq: Once | ORAL | Status: AC
  Administered 2018-01-19: 650 mg via ORAL
  Filled 2018-01-19: qty 2
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
## 2018-01-19 NOTE — Discharge Instructions (Signed)
Advised over-the-counter extra strength Tylenol for pain complaint due to gestational state.  Follow-up with PCP if no improvement in 1 week.
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
## 2018-04-01 LAB — OB RESULTS CONSOLE GC/CHLAMYDIA
Chlamydia: NEGATIVE
Gonorrhea: NEGATIVE

## 2018-04-01 NOTE — H&P (Addendum)
Lillieann Pavlich is a 27 y.o. female presenting for scheduled elective repeat LTCS . EDC 04/29/18 and confirmatory u/s at 7 weeks .  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 Bipolar 1 disorder, depressed, full remission (HCC)    controlled with lamictal and prozac  . Depression 2010   hosp for suical ideation at [facility]   . Menstrual irregularity    occurring every 2 or 3 months   Past Surgical History:  Procedure Laterality Date  . CESAREAN SECTION  04/28/2016   Procedure: CESAREAN SECTION;  Surgeon: Suzy Bouchard, MD;  Location: [facility] ORS;  Service: Obstetrics;;  . NO PAST SURGERIES     Family History: family history includes Diabetes in her father. Social History:  reports that she quit smoking about 6 years ago. Her smoking use included cigarettes. She smoked 0.10 packs per day. She has never used smokeless tobacco. She reports that she does not drink alcohol or use drugs.     Maternal Diabetes: No unsure  Elevated 1 hr , pt didn't complete 3 hr GTT  Genetic Screening: Abnormal:  Results: Other: 5% PAPP-A Maternal Ultrasounds/Referrals: Normal Fetal Ultrasounds or other Referrals:  None Maternal Substance Abuse:  Yes:  Type: Smoker Significant Maternal Medications:  None Significant Maternal Lab Results:  Lab values include: Other:  Other Comments:  low PAPP-A  ROS History   unknown if currently breastfeeding. Exam Physical Exam  Lungs CTA   CV RRR  abd : gravid nt    Prenatal labs: ABO, Rh: --/--/O POS (04/14 1728) Antibody: NEG (04/14 1728) Rubella: 1.32 (04/14 1729) RPR: Non Reactive (04/14 1729)   HBsAg: Negative (04/14 1728)  HIV: Non Reactive (04/16 1914)  GBS:   done on 04/01/18  Assessment/Plan: Elective repeat LTCS  on 04/28/18 Consents signed   risks of the procedure discussed with the pt  She was scheduled for BTL but now today declines   Ihor Austin Sylar Voong 04/01/2018, 2:52 PM

## 2018-04-03 ENCOUNTER — Inpatient Hospital Stay: Payer: Medicaid Other

## 2018-04-03 ENCOUNTER — Observation Stay
Admission: EM | Admit: 2018-04-03 | Discharge: 2018-04-03 | Disposition: A | Discharge status: Home / Self Care | Payer: Medicaid Other | Attending: Obstetrics and Gynecology | Admitting: Obstetrics and Gynecology

## 2018-04-03 DIAGNOSIS — Z3A35 35 weeks gestation of pregnancy: Secondary | ICD-10-CM | POA: Diagnosis not present

## 2018-04-03 DIAGNOSIS — O0993 Supervision of high risk pregnancy, unspecified, third trimester: Secondary | ICD-10-CM | POA: Diagnosis present

## 2018-04-03 DIAGNOSIS — Z87891 Personal history of nicotine dependence: Secondary | ICD-10-CM | POA: Insufficient documentation

## 2018-04-03 DIAGNOSIS — F319 Bipolar disorder, unspecified: Secondary | ICD-10-CM | POA: Diagnosis not present

## 2018-04-03 DIAGNOSIS — O99343 Other mental disorders complicating pregnancy, third trimester: Secondary | ICD-10-CM | POA: Insufficient documentation

## 2018-04-03 DIAGNOSIS — O288 Other abnormal findings on antenatal screening of mother: Secondary | ICD-10-CM | POA: Diagnosis present

## 2018-04-03 NOTE — Discharge Summary (Signed)
Sugar Beverly Gust is a 27 y.o. female. She is at [redacted]w[redacted]d gestation. No LMP recorded. Patient is pregnant. Estimated Date of Delivery: 05/02/18  [facility]renatal care site: [facility]   Current pregnancy complicated by:  1. BMI 42 2. Low PAPP-A 3. Prior C/S, plans repeat 4. Hx depression/anxiety  Chief complaint: NR NST in Office today.    S: Resting comfortably. no CTX, no VB.no LOF,  Active fetal movement.  Denies: HA, visual changes, SOB, or RUQ/epigastric pain  Maternal Medical History:   Past Medical History:  Diagnosis Date  . Bipolar 1 disorder, depressed, full remission (HCC)    controlled with lamictal and prozac  . Depression 2010   hosp for suical ideation at [facility]   . Menstrual irregularity    occurring every 2 or 3 months    Past Surgical History:  Procedure Laterality Date  . CESAREAN SECTION  04/28/2016   Procedure: CESAREAN SECTION;  Surgeon: Suzy Bouchard, MD;  Location: [facility] ORS;  Service: Obstetrics;;  . NO PAST SURGERIES      No Known Allergies  Prior to Admission medications   Medication Sig Start Date End Date Taking? Authorizing Provider  doxylamine, Sleep, (UNISOM) 25 MG tablet Take 1 tablet (25 mg total) by mouth 2 (two) times daily. Patient not taking: Reported on 10/23/2017 09/17/17   Ward, Elenora Fender, MD  flintstones complete (FLINTSTONES) 60 MG chewable tablet Chew 2 tablets by mouth daily.    [provider]  metoCLOPramide (REGLAN) 10 MG tablet Take 1 tablet (10 mg total) by mouth every 8 (eight) hours. Patient not taking: Reported on 10/23/2017 09/17/17   Ward, Elenora Fender, MD  promethazine (PHENERGAN) 25 MG suppository Place 25 mg rectally every 6 (six) hours as needed for nausea or vomiting.    [provider]  promethazine (PHENERGAN) 25 MG tablet Take 1 tablet (25 mg total) by mouth every 6 (six) hours. Patient not taking: Reported on 10/23/2017 09/17/17   Ward, Elenora Fender, MD  pyridOXINE (B-6) 50 MG tablet Take  1 tablet (50 mg total) by mouth 2 (two) times daily. Patient not taking: Reported on 10/23/2017 09/17/17   Ward, Elenora Fender, MD      Social History: She  reports that she quit smoking about 6 years ago. Her smoking use included cigarettes. She smoked 0.10 packs per day. She has never used smokeless tobacco. She reports that she does not drink alcohol or use drugs.  Family History: family history includes Diabetes in her father.   Review of Systems: A full review of systems was performed and negative except as noted in the HPI.     O:  BP 117/73 (BP Location: Left Arm)   Pulse (!) 102   Temp 98 F (36.7 C) (Oral)   Resp 16  No results found for this or any previous visit (from the past 48 hour(s)).   Constitutional: NAD, AAOx3  HE/ENT: extraocular movements grossly intact, moist mucous membranes CV: RRR PULM: nl respiratory effort, CTABL     Abd: gravid, non-tender, non-distended, soft      Ext: Non-tender, Nonedematous   Psych: mood appropriate, speech normal Pelvic: deferred.  Fetal  monitoring: Cat I  Appropriate for GA Baseline: 125bpm Variability: moderate Accelerations:  present x >2 Decelerations absent Time: 1.5hrs  Ob Limited done: unofficial result per [place] tech AFI: 6.8cm, fetus with full stomach and bladder.    A/P: 27 y.o. [redacted]w[redacted]d here for antenatal surveillance for NR NST in office  Principle  Diagnosis:  High risk pregnancy in third trimester   Fetal Wellbeing: Reassuring Cat 1 tracing, Reactive NST   AFI: low normal- needs AFI/NST Monday.   D/c home stable, precautions reviewed  Randa Ngo, CNM 04/03/2018  6:57 PM

## 2018-04-27 ENCOUNTER — Other Ambulatory Visit: Payer: Self-pay

## 2018-04-27 ENCOUNTER — Encounter: Payer: Self-pay | Admitting: Anesthesiology

## 2018-04-27 ENCOUNTER — Encounter
Admission: RE | Admit: 2018-04-27 | Discharge: 2018-04-27 | Disposition: A | Discharge status: Home / Self Care | Payer: Medicaid Other | Source: Ambulatory Visit | Attending: Obstetrics and Gynecology | Admitting: Obstetrics and Gynecology

## 2018-04-27 DIAGNOSIS — Z01812 Encounter for preprocedural laboratory examination: Secondary | ICD-10-CM

## 2018-04-27 LAB — BASIC METABOLIC PANEL
ANION GAP: 7 (ref 5–15)
BUN: 9 mg/dL (ref 6–20)
CALCIUM: 8.6 mg/dL — AB (ref 8.9–10.3)
CO2: 22 mmol/L (ref 22–32)
CREATININE: 0.5 mg/dL (ref 0.44–1.00)
Chloride: 108 mmol/L (ref 98–111)
GFR calc non Af Amer: >60 mL/min (ref >60)
Glucose, Bld: 79 mg/dL (ref 70–99)
Potassium: 3.4 mmol/L — ABNORMAL LOW (ref 3.5–5.1)
SODIUM: 137 mmol/L (ref 135–145)

## 2018-04-27 LAB — CBC
HCT: 32 % — ABNORMAL LOW (ref 36.0–46.0)
Hemoglobin: 10.2 g/dL — ABNORMAL LOW (ref 12.0–15.0)
MCH: 26 pg (ref 26.0–34.0)
MCHC: 31.9 g/dL (ref 30.0–36.0)
MCV: 81.4 fL (ref 80.0–100.0)
NRBC: 0 % (ref 0.0–0.2)
PLATELETS: 294 10*3/uL (ref 150–400)
RBC: 3.93 MIL/uL (ref 3.87–5.11)
RDW: 15 % (ref 11.5–15.5)
WBC: 7.8 10*3/uL (ref 4.0–10.5)

## 2018-04-27 LAB — TYPE AND SCREEN
ABO/RH(D): O POS
Antibody Screen: NEGATIVE
Extend sample reason: UNDETERMINED

## 2018-04-27 NOTE — Patient Instructions (Signed)
Your procedure is scheduled on: April 28, 2018 TUESDAY Report to EMERGENCY DEPARTMENT AT 8:45AM    REMEMBER: Instructions that are not followed completely may result in serious medical risk, up to and including death; or upon the discretion of your surgeon and anesthesiologist your surgery may need to be rescheduled.  Do not eat food after midnight the night before surgery.  No gum chewing, lozengers or hard candies.  You may however, drink CLEAR liquids up to 2 hours before you are scheduled to arrive for your surgery. Do not drink anything within 2 hours of the start of your surgery.  Clear liquids include: - water  - apple juice without pulp - CLEAR gatorade - black coffee or tea (Do NOT add milk or creamers to the coffee or tea) Do NOT drink anything that is not on this list.  No Alcohol for 24 hours before or after surgery.  No Smoking including e-cigarettes for 24 hours prior to surgery.  No chewable tobacco products for at least 6 hours prior to surgery.  No nicotine patches on the day of surgery.  On the morning of surgery brush your teeth with toothpaste and water, you may rinse your mouth with mouthwash if you wish. Do not swallow any toothpaste or mouthwash.  Notify your doctor if there is any change in your medical condition (cold, fever, infection).  Do not wear jewelry, make-up, hairpins, clips or nail polish.  Do not wear lotions, powders, or perfumes.   Do not shave 48 hours prior to surgery.   Contacts and dentures may not be worn into surgery.  Do not bring valuables to the hospital, including drivers license, insurance or credit cards.  [facility] is not responsible for any belongings or valuables.   TAKE THESE MEDICATIONS THE MORNING OF SURGERY: NONE   Use CHG  or wipes as directed on instruction sheet.   Follow recommendations from Cardiologist, Pulmonologist or PCP regarding stopping Aspirin, Coumadin, Plavix, Eliquis, Pradaxa, or  Pletal.  Stop Anti-inflammatories (NSAIDS) such as Advil, Aleve, Ibuprofen, Motrin, Naproxen, Naprosyn and Aspirin based products such as Excedrin, Goodys Powder, BC Powder. (May take Tylenol or Acetaminophen if needed.)  Stop ANY OVER THE COUNTER supplements until after surgery. (May continue Vitamin D, Vitamin B, and multivitamin.)  Wear comfortable clothing (specific to your surgery type) to the hospital.  Plan for stool softeners for home use.  If you are being admitted to the hospital overnight, leave your suitcase in the car. After surgery it may be brought to your room.  If you are being discharged the day of surgery, you will not be allowed to drive home. You will need a responsible adult to drive you home and stay with you that night.   If you are taking public transportation, you will need to have a responsible adult with you. Please confirm with your physician that it is acceptable to use public transportation.   Please call [phone] if you have any questions about these instructions.

## 2018-04-28 ENCOUNTER — Other Ambulatory Visit: Payer: Self-pay

## 2018-04-28 ENCOUNTER — Inpatient Hospital Stay
Admission: RE | Admit: 2018-04-28 | Payer: Medicaid Other | Source: Ambulatory Visit | Admitting: Obstetrics and Gynecology

## 2018-04-28 ENCOUNTER — Inpatient Hospital Stay: Payer: Medicaid Other | Admitting: Anesthesiology

## 2018-04-28 ENCOUNTER — Encounter
Admission: EM | Disposition: A | Discharge status: Home / Self Care | Payer: Self-pay | Source: Home / Self Care | Attending: Obstetrics and Gynecology

## 2018-04-28 ENCOUNTER — Inpatient Hospital Stay
Admission: EM | Admit: 2018-04-28 | Discharge: 2018-04-30 | DRG: 788 | Disposition: A | Discharge status: Home / Self Care | Payer: Medicaid Other | Attending: Obstetrics and Gynecology | Admitting: Obstetrics and Gynecology

## 2018-04-28 DIAGNOSIS — L910 Hypertrophic scar: Secondary | ICD-10-CM | POA: Diagnosis present

## 2018-04-28 DIAGNOSIS — O9902 Anemia complicating childbirth: Secondary | ICD-10-CM | POA: Diagnosis present

## 2018-04-28 DIAGNOSIS — O34219 Maternal care for unspecified type scar from previous cesarean delivery: Secondary | ICD-10-CM | POA: Diagnosis present

## 2018-04-28 DIAGNOSIS — Z87891 Personal history of nicotine dependence: Secondary | ICD-10-CM | POA: Diagnosis not present

## 2018-04-28 DIAGNOSIS — O99214 Obesity complicating childbirth: Secondary | ICD-10-CM | POA: Diagnosis present

## 2018-04-28 DIAGNOSIS — O34211 Maternal care for low transverse scar from previous cesarean delivery: Secondary | ICD-10-CM | POA: Diagnosis present

## 2018-04-28 DIAGNOSIS — Z3A39 39 weeks gestation of pregnancy: Secondary | ICD-10-CM | POA: Diagnosis not present

## 2018-04-28 DIAGNOSIS — D649 Anemia, unspecified: Secondary | ICD-10-CM | POA: Diagnosis present

## 2018-04-28 DIAGNOSIS — O9972 Diseases of the skin and subcutaneous tissue complicating childbirth: Secondary | ICD-10-CM | POA: Diagnosis present

## 2018-04-28 DIAGNOSIS — Z9889 Other specified postprocedural states: Secondary | ICD-10-CM

## 2018-04-28 HISTORY — PX: EXCISION OF KELOID: SHX6267

## 2018-04-28 LAB — COMPREHENSIVE METABOLIC PANEL
ALT: 9 U/L (ref 0–44)
ANION GAP: 9 (ref 5–15)
AST: 17 U/L (ref 15–41)
Albumin: 3.2 g/dL — ABNORMAL LOW (ref 3.5–5.0)
Alkaline Phosphatase: 114 U/L (ref 38–126)
BUN: 14 mg/dL (ref 6–20)
CHLORIDE: 106 mmol/L (ref 98–111)
CO2: 20 mmol/L — AB (ref 22–32)
Calcium: 8.5 mg/dL — ABNORMAL LOW (ref 8.9–10.3)
Creatinine, Ser: 0.53 mg/dL (ref 0.44–1.00)
GFR calc non Af Amer: >60 mL/min (ref >60)
Glucose, Bld: 66 mg/dL — ABNORMAL LOW (ref 70–99)
Potassium: 3.5 mmol/L (ref 3.5–5.1)
SODIUM: 135 mmol/L (ref 135–145)
Total Bilirubin: 1.2 mg/dL (ref 0.3–1.2)
Total Protein: 6.9 g/dL (ref 6.5–8.1)

## 2018-04-28 LAB — CBC
HCT: 31.4 % — ABNORMAL LOW (ref 36.0–46.0)
HEMOGLOBIN: 10 g/dL — AB (ref 12.0–15.0)
MCH: 25.8 pg — AB (ref 26.0–34.0)
MCHC: 31.8 g/dL (ref 30.0–36.0)
MCV: 81.1 fL (ref 80.0–100.0)
Platelets: 280 10*3/uL (ref 150–400)
RBC: 3.87 MIL/uL (ref 3.87–5.11)
RDW: 15 % (ref 11.5–15.5)
WBC: 8 10*3/uL (ref 4.0–10.5)
nRBC: 0 % (ref 0.0–0.2)

## 2018-04-28 LAB — TYPE AND SCREEN
ABO/RH(D): O POS
ANTIBODY SCREEN: NEGATIVE

## 2018-04-28 LAB — PROTEIN / CREATININE RATIO, URINE
Creatinine, Urine: 122 mg/dL
PROTEIN CREATININE RATIO: 0.61 mg/mg{creat} — AB (ref 0.00–0.15)
TOTAL PROTEIN, URINE: 75 mg/dL

## 2018-04-28 LAB — RPR: RPR Ser Ql: NONREACTIVE

## 2018-04-28 LAB — HEMOGLOBIN A1C
Hgb A1c MFr Bld: 5.2 % (ref 4.8–5.6)
MEAN PLASMA GLUCOSE: 102.54 mg/dL

## 2018-04-28 SURGERY — Surgical Case
Anesthesia: Spinal

## 2018-04-28 MED ORDER — TETANUS-DIPHTH-ACELL PERTUSSIS 5-2.5-18.5 LF-MCG/0.5 IM SUSP
0.5000 mL | Freq: Once | INTRAMUSCULAR | Status: DC
  Filled 2018-04-28: qty 0.5

## 2018-04-28 MED ORDER — PRENATAL MULTIVITAMIN CH
1.0000 | ORAL_TABLET | Freq: Every day | ORAL | Status: DC
  Administered 2018-04-29: 1 via ORAL
  Filled 2018-04-28: qty 1

## 2018-04-28 MED ORDER — METHYLERGONOVINE MALEATE 0.2 MG/ML IJ SOLN
INTRAMUSCULAR | Status: AC
  Filled 2018-04-28: qty 1

## 2018-04-28 MED ORDER — MEPERIDINE HCL 25 MG/ML IJ SOLN: 6.2500 mg | INTRAMUSCULAR | Status: DC | PRN

## 2018-04-28 MED ORDER — CEFAZOLIN SODIUM-DEXTROSE 2-4 GM/100ML-% IV SOLN
2.0000 g | INTRAVENOUS | Status: AC
  Administered 2018-04-28: 2 g via INTRAVENOUS
  Filled 2018-04-28: qty 100

## 2018-04-28 MED ORDER — LIDOCAINE HCL (PF) 1 % IJ SOLN: 30.0000 mL | INTRAMUSCULAR | Status: DC | PRN

## 2018-04-28 MED ORDER — BUPIVACAINE IN DEXTROSE 0.75-8.25 % IT SOLN
INTRATHECAL | Status: DC | PRN
  Administered 2018-04-28: 1.6 mL via INTRATHECAL

## 2018-04-28 MED ORDER — EPHEDRINE SULFATE 50 MG/ML IJ SOLN
INTRAMUSCULAR | Status: AC
  Filled 2018-04-28: qty 1

## 2018-04-28 MED ORDER — MORPHINE SULFATE (PF) 0.5 MG/ML IJ SOLN
INTRAMUSCULAR | Status: DC | PRN
  Administered 2018-04-28: .2 mg via INTRATHECAL

## 2018-04-28 MED ORDER — SIMETHICONE 80 MG PO CHEW: 80.0000 mg | CHEWABLE_TABLET | ORAL | Status: DC | PRN

## 2018-04-28 MED ORDER — LACTATED RINGERS IV SOLN
INTRAVENOUS | Status: DC
  Administered 2018-04-29: 04:00:00 via INTRAVENOUS

## 2018-04-28 MED ORDER — SODIUM CHLORIDE (PF) 0.9 % IJ SOLN
INTRAMUSCULAR | Status: DC | PRN
  Administered 2018-04-28: 50 mL

## 2018-04-28 MED ORDER — METHYLERGONOVINE MALEATE 0.2 MG/ML IJ SOLN
INTRAMUSCULAR | Status: DC | PRN
  Administered 2018-04-28: 0.2 mg via INTRAMUSCULAR

## 2018-04-28 MED ORDER — NALOXONE HCL 0.4 MG/ML IJ SOLN: 0.4000 mg | INTRAMUSCULAR | Status: DC | PRN

## 2018-04-28 MED ORDER — FENTANYL CITRATE (PF) 100 MCG/2ML IJ SOLN: 25.0000 ug | INTRAMUSCULAR | Status: DC | PRN

## 2018-04-28 MED ORDER — CARBOPROST TROMETHAMINE 250 MCG/ML IM SOLN
INTRAMUSCULAR | Status: AC
  Filled 2018-04-28: qty 1

## 2018-04-28 MED ORDER — SENNOSIDES-DOCUSATE SODIUM 8.6-50 MG PO TABS
2.0000 | ORAL_TABLET | ORAL | Status: DC
  Administered 2018-04-29: 2 via ORAL
  Filled 2018-04-28 (×2): qty 2

## 2018-04-28 MED ORDER — MENTHOL 3 MG MT LOZG
1.0000 | LOZENGE | OROMUCOSAL | Status: DC | PRN
  Filled 2018-04-28: qty 9

## 2018-04-28 MED ORDER — ZOLPIDEM TARTRATE 5 MG PO TABS: 5.0000 mg | ORAL_TABLET | Freq: Every evening | ORAL | Status: DC | PRN

## 2018-04-28 MED ORDER — ACETAMINOPHEN 325 MG PO TABS
650.0000 mg | ORAL_TABLET | ORAL | Status: DC | PRN
  Administered 2018-04-30 (×2): 650 mg via ORAL
  Filled 2018-04-28 (×2): qty 2

## 2018-04-28 MED ORDER — OXYTOCIN 40 UNITS IN LACTATED RINGERS INFUSION - SIMPLE MED
2.5000 [IU]/h | INTRAVENOUS | Status: DC
  Administered 2018-04-28 (×2): 2.5 [IU]/h via INTRAVENOUS
  Filled 2018-04-28: qty 1000

## 2018-04-28 MED ORDER — DIPHENHYDRAMINE HCL 50 MG/ML IJ SOLN: 12.5000 mg | INTRAMUSCULAR | Status: DC | PRN

## 2018-04-28 MED ORDER — ONDANSETRON HCL 4 MG/2ML IJ SOLN
4.0000 mg | Freq: Three times a day (TID) | INTRAMUSCULAR | Status: DC | PRN
  Administered 2018-04-28: 4 mg via INTRAVENOUS
  Filled 2018-04-28: qty 2

## 2018-04-28 MED ORDER — OXYTOCIN BOLUS FROM INFUSION: 500.0000 mL | Freq: Once | INTRAVENOUS | Status: DC

## 2018-04-28 MED ORDER — PROPOFOL 10 MG/ML IV BOLUS
INTRAVENOUS | Status: AC
  Filled 2018-04-28: qty 20

## 2018-04-28 MED ORDER — OXYTOCIN 40 UNITS IN LACTATED RINGERS INFUSION - SIMPLE MED
2.5000 [IU]/h | INTRAVENOUS | Status: DC
  Filled 2018-04-28: qty 1000

## 2018-04-28 MED ORDER — SODIUM CHLORIDE 0.9% FLUSH: 3.0000 mL | INTRAVENOUS | Status: DC | PRN

## 2018-04-28 MED ORDER — IBUPROFEN 600 MG PO TABS
600.0000 mg | ORAL_TABLET | Freq: Four times a day (QID) | ORAL | Status: DC
  Administered 2018-04-29 – 2018-04-30 (×4): 600 mg via ORAL
  Filled 2018-04-28 (×5): qty 1

## 2018-04-28 MED ORDER — BUPIVACAINE LIPOSOME 1.3 % IJ SUSP
20.0000 mL | Freq: Once | INTRAMUSCULAR | Status: DC
  Filled 2018-04-28: qty 20

## 2018-04-28 MED ORDER — OXYTOCIN 40 UNITS IN LACTATED RINGERS INFUSION - SIMPLE MED
INTRAVENOUS | Status: DC | PRN
  Administered 2018-04-28: 490 mL via INTRAVENOUS
  Administered 2018-04-28: 10 mL via INTRAVENOUS

## 2018-04-28 MED ORDER — WITCH HAZEL-GLYCERIN EX PADS: 1.0000 "application " | MEDICATED_PAD | CUTANEOUS | Status: DC | PRN

## 2018-04-28 MED ORDER — DIPHENHYDRAMINE HCL 25 MG PO CAPS: 25.0000 mg | ORAL_CAPSULE | Freq: Four times a day (QID) | ORAL | Status: DC | PRN

## 2018-04-28 MED ORDER — NALBUPHINE HCL 10 MG/ML IJ SOLN: 5.0000 mg | INTRAMUSCULAR | Status: DC | PRN

## 2018-04-28 MED ORDER — ONDANSETRON HCL 4 MG/2ML IJ SOLN: 4.0000 mg | Freq: Once | INTRAMUSCULAR | Status: DC | PRN

## 2018-04-28 MED ORDER — PHENYLEPHRINE HCL 10 MG/ML IJ SOLN
INTRAMUSCULAR | Status: AC
  Filled 2018-04-28: qty 1

## 2018-04-28 MED ORDER — EPHEDRINE SULFATE 50 MG/ML IJ SOLN
INTRAMUSCULAR | Status: DC | PRN
  Administered 2018-04-28: 10 mg via INTRAVENOUS

## 2018-04-28 MED ORDER — SODIUM CHLORIDE 0.9 % IV SOLN
INTRAVENOUS | Status: DC | PRN
  Administered 2018-04-28: 45 ug/min via INTRAVENOUS

## 2018-04-28 MED ORDER — COCONUT OIL OIL: 1.0000 "application " | TOPICAL_OIL | Status: DC | PRN

## 2018-04-28 MED ORDER — ONDANSETRON HCL 4 MG/2ML IJ SOLN: 4.0000 mg | Freq: Four times a day (QID) | INTRAMUSCULAR | Status: DC | PRN

## 2018-04-28 MED ORDER — ONDANSETRON HCL 4 MG/2ML IJ SOLN
INTRAMUSCULAR | Status: AC
  Filled 2018-04-28: qty 2

## 2018-04-28 MED ORDER — DIBUCAINE 1 % RE OINT: 1.0000 "application " | TOPICAL_OINTMENT | RECTAL | Status: DC | PRN

## 2018-04-28 MED ORDER — KETOROLAC TROMETHAMINE 30 MG/ML IJ SOLN: 30.0000 mg | Freq: Four times a day (QID) | INTRAMUSCULAR | Status: DC | PRN

## 2018-04-28 MED ORDER — MORPHINE SULFATE (PF) 0.5 MG/ML IJ SOLN
INTRAMUSCULAR | Status: AC
  Filled 2018-04-28: qty 10

## 2018-04-28 MED ORDER — SIMETHICONE 80 MG PO CHEW
80.0000 mg | CHEWABLE_TABLET | Freq: Three times a day (TID) | ORAL | Status: DC
  Administered 2018-04-29 – 2018-04-30 (×4): 80 mg via ORAL
  Filled 2018-04-28 (×4): qty 1

## 2018-04-28 MED ORDER — BUPIVACAINE HCL (PF) 0.5 % IJ SOLN
30.0000 mL | Freq: Once | INTRAMUSCULAR | Status: DC
  Filled 2018-04-28: qty 30

## 2018-04-28 MED ORDER — LACTATED RINGERS IV SOLN
INTRAVENOUS | Status: DC
  Administered 2018-04-28: 10:00:00 via INTRAVENOUS

## 2018-04-28 MED ORDER — KETOROLAC TROMETHAMINE 30 MG/ML IJ SOLN
30.0000 mg | Freq: Four times a day (QID) | INTRAMUSCULAR | Status: DC | PRN
  Administered 2018-04-28 – 2018-04-29 (×3): 30 mg via INTRAVENOUS
  Filled 2018-04-28 (×4): qty 1

## 2018-04-28 MED ORDER — ACETAMINOPHEN 500 MG PO TABS
1000.0000 mg | ORAL_TABLET | Freq: Four times a day (QID) | ORAL | Status: DC
  Administered 2018-04-29: 1000 mg via ORAL
  Filled 2018-04-28 (×2): qty 2

## 2018-04-28 MED ORDER — DIPHENHYDRAMINE HCL 25 MG PO CAPS: 25.0000 mg | ORAL_CAPSULE | ORAL | Status: DC | PRN

## 2018-04-28 MED ORDER — LACTATED RINGERS IV SOLN
500.0000 mL | INTRAVENOUS | Status: DC | PRN
  Administered 2018-04-28: 1000 mL via INTRAVENOUS

## 2018-04-28 MED ORDER — SODIUM CHLORIDE (PF) 0.9 % IJ SOLN
INTRAMUSCULAR | Status: AC
  Filled 2018-04-28: qty 50

## 2018-04-28 MED ORDER — BUPIVACAINE LIPOSOME 1.3 % IJ SUSP
INTRAMUSCULAR | Status: DC | PRN
  Administered 2018-04-28: 50 mL

## 2018-04-28 MED ORDER — SOD CITRATE-CITRIC ACID 500-334 MG/5ML PO SOLN
30.0000 mL | ORAL | Status: DC | PRN
  Administered 2018-04-28: 30 mL via ORAL
  Filled 2018-04-28: qty 30

## 2018-04-28 MED ORDER — OXYCODONE HCL 5 MG PO TABS
5.0000 mg | ORAL_TABLET | ORAL | Status: DC | PRN
  Administered 2018-04-30: 5 mg via ORAL
  Filled 2018-04-28: qty 1

## 2018-04-28 MED ORDER — SIMETHICONE 80 MG PO CHEW: 80.0000 mg | CHEWABLE_TABLET | ORAL | Status: DC

## 2018-04-28 MED ORDER — OXYCODONE HCL 5 MG PO TABS
10.0000 mg | ORAL_TABLET | ORAL | Status: DC | PRN
  Administered 2018-04-29: 10 mg via ORAL
  Filled 2018-04-28: qty 2

## 2018-04-28 SURGICAL SUPPLY — 26 items
BARRIER ADHS 3X4 INTERCEED (GAUZE/BANDAGES/DRESSINGS) ×4 IMPLANT
CANISTER SUCT 3000ML PPV (MISCELLANEOUS) ×4 IMPLANT
CHLORAPREP W/TINT 26ML (MISCELLANEOUS) ×4 IMPLANT
COVER WAND RF STERILE (DRAPES) ×4 IMPLANT
DRSG TELFA 3X8 NADH (GAUZE/BANDAGES/DRESSINGS) ×4 IMPLANT
ELECT CAUTERY BLADE 6.4 (BLADE) ×4 IMPLANT
ELECT REM PT RETURN 9FT ADLT (ELECTROSURGICAL) ×4
ELECTRODE REM PT RTRN 9FT ADLT (ELECTROSURGICAL) ×2 IMPLANT
GAUZE SPONGE 4X4 12PLY STRL (GAUZE/BANDAGES/DRESSINGS) ×4 IMPLANT
GLOVE BIO SURGEON STRL SZ8 (GLOVE) ×4 IMPLANT
GOWN STRL REUS W/ TWL LRG LVL3 (GOWN DISPOSABLE) ×4 IMPLANT
GOWN STRL REUS W/ TWL XL LVL3 (GOWN DISPOSABLE) ×2 IMPLANT
GOWN STRL REUS W/TWL LRG LVL3 (GOWN DISPOSABLE) ×4
GOWN STRL REUS W/TWL XL LVL3 (GOWN DISPOSABLE) ×2
NEEDLE HYPO 22GX1.5 SAFETY (NEEDLE) ×4 IMPLANT
NS IRRIG 1000ML POUR BTL (IV SOLUTION) ×4 IMPLANT
PACK C SECTION AR (MISCELLANEOUS) ×4 IMPLANT
PAD OB MATERNITY 4.3X12.25 (PERSONAL CARE ITEMS) ×4 IMPLANT
PAD PREP 24X41 OB/GYN DISP (PERSONAL CARE ITEMS) ×4 IMPLANT
STAPLER INSORB 30 2030 C-SECTI (MISCELLANEOUS) ×4 IMPLANT
STRAP SAFETY 5IN WIDE (MISCELLANEOUS) ×4 IMPLANT
SUT CHROMIC 1 CTX 36 (SUTURE) ×12 IMPLANT
SUT PLAIN GUT 0 (SUTURE) ×8 IMPLANT
SUT VIC AB 0 CT1 36 (SUTURE) ×12 IMPLANT
SYR 30ML LL (SYRINGE) ×8 IMPLANT
TUBE SUCT KAM VAC (TUBING) ×4 IMPLANT

## 2018-04-28 NOTE — Anesthesia Post-op Follow-up Note (Signed)
Anesthesia QCDR form completed.        

## 2018-04-28 NOTE — Care Management (Signed)
She c/o severe back pain, initially. Now much better.

## 2018-04-28 NOTE — Progress Notes (Signed)
Patient ID: Pamela Robertson, female   DOB: 06/28/1990, 27 y.o.   MRN: [id number] Ready for surgery . All questions answered . Declines BTL

## 2018-04-28 NOTE — Op Note (Signed)
[name]: TAAMAI-LEE, [name] MEDICAL RECORD [id number] ACCOUNT [id number] DATE OF BIRTH:1991/02/26 FACILITY: [facility] LOCATION: [facility] PHYSICIAN:Haileigh Pitz Cloyde ReamsJ. Akire Rennert, MD  OPERATIVE REPORT  DATE OF PROCEDURE:  04/28/2018  PREOPERATIVE DIAGNOSES: 1.  39+6 weeks estimated gestational age. 2.  Elective repeat cesarean section. 3.  Keloid formation of previous Pfannenstiel incision.  POSTOPERATIVE DIAGNOSES:   1.  39+6 weeks estimated gestational age. 2.  Elective repeat cesarean section. 3.  Keloid formation of previous Pfannenstiel incision.  PROCEDURE:   1.  Repeat low transverse cesarean section. 2.  Removal of cicatrix.  ANESTHESIA:  Spinal.  SURGEON:  Jennell Cornerhomas Suzan Manon, MD.  FIRST ASSISTANT:  McVey, certified nurse midwife.  INDICATIONS:  A 27 year old gravida 2, para 1 patient with a previous cesarean section and has elected for repeat cesarean section.  Estimated gestational age 25+6 weeks.  DESCRIPTION OF PROCEDURE:  After adequate spinal anesthesia, the patient was placed in dorsal supine position, hip roll on the right side.  The patient had received 2 grams IV Ancef prior to commencement of the case.  Timeout was performed.  Pfannenstiel  incision was made with removal of cicatrix.  Sharp dissection was used to identify the fascia.  Fascia was opened in the midline and opened in a transverse fashion.  Superior aspect of the fascia was grasped with Kocher clamps and the recti muscles were  dissected free.  Inferior aspect of the fascia was grasped with Kocher clamps and the pyramidalis muscle was dissected free.  Entry into the peritoneal cavity was accomplished sharply.  The vesicouterine peritoneal fold was identified and opened and the  bladder was reflected inferiorly.  A low transverse uterine incision was made.  Upon entry into the endometrial cavity, clear fluid resulted.  Fetal head was brought to the incision and a Kiwi bell vacuum was applied to the occiput to  aid in delivery  with 1 pull of the vacuum the fetal head was delivered and the Kiwi vacuum was removed.  The shoulders and body were delivered without difficulty and a vigorous female was dried on the patient's abdomen.  After 60 seconds, cord was doubly clamped and a  vigorous female was passed to nursery staff who assigned Apgar scores of 8 and 9.  Time of birth 11:25 on 04/28/2018.  Fetal weight 7 pounds 5 ounces.  The placenta was manually delivered and the uterus was exteriorized.  The endometrial cavity was wiped  clean with laparotomy tape and the cervix was opened with a ring forceps and this was passed off the operative field.  Uterine incision was closed with #1 chromic suture in a running locking fashion.  Several additional figure-of-eight sutures were  required for good hemostasis.  Uterus was somewhat atonic, and, therefore, the patient was given 0.2 mg intramuscular Methergine for uterine tone.  Intravenous Pitocin was being administered during thi1s time.  Fallopian tubes and ovaries appeared  normal.  Posterior cul-de-sac was irrigated and suctioned.  Uterus was placed back into the abdominal cavity and the pericolic gutters were wiped clean with laparotomy tape.  Again, the uterine incision appeared hemostatic.  The fascia was then closed  with 0 Vicryl suture in a running locking fashion.  Good approximation of tissues.  Good hemostasis.  Subcutaneous tissues were irrigated.  The fascial edges were then injected with a solution of 20 mL of 1.3% Exparel plus 30 mL of 0.5% Marcaine plus 50  mL normal saline; 60 mL of the solution was injected subfascially.  Subcutaneous tissues were then irrigated and bovied for  hemostasis.  The subcutaneous tissue was approximated at 4 cm.  Therefore, a 2-0 chromic suture was used to close subcutaneous  dead space.  Skin was then reapproximated with Insorb absorbable staples with good cosmetic effect.  Good hemostasis was noted.  COMPLICATIONS:  There were  no complications.  ESTIMATED BLOOD LOSS:  700 mL  INTRAOPERATIVE FLUIDS:  750 mL  DISPOSITION:  Patient was taken to recovery room in good condition.  TN/NUANCE  D:04/28/2018 T:04/28/2018 JOB:[id number]

## 2018-04-28 NOTE — Brief Op Note (Signed)
04/28/2018  12:01 PM  PATIENT:  Pamela Robertson  27 y.o. female  PRE-OPERATIVE DIAGNOSIS:  elective repeat cesarean section Keloid  Scar  39+6 weeks  POST-OPERATIVE DIAGNOSIS:  elective repeat cesarean section Same as above  PROCEDURE:  Procedure(s) with comments: CESAREAN SECTION, repeat (N/A) - TOB 1125 APGAR 8/9 7lb 5oz EXCISION OF KELOID  SURGEON:  Surgeon(s) and Role:    * Schermerhorn, Ihor Austinhomas J, MD - Primary  PHYSICIAN ASSISTANT:  Mcvey , cnm   ASSISTANTS: none   ANESTHESIA:   spinal  EBL:  750 mL   BLOOD ADMINISTERED:none  DRAINS: Urinary Catheter (Foley)   LOCAL MEDICATIONS USED:  MARCAINE    and BUPIVICAINE   SPECIMEN:  No Specimen  DISPOSITION OF SPECIMEN:  N/A  COUNTS:  YES  TOURNIQUET:  * No tourniquets in log *  DICTATION: .Other Dictation: Dictation Number verbal  PLAN OF CARE: Admit to inpatient   PATIENT DISPOSITION:  PACU - hemodynamically stable.   Delay start of Pharmacological VTE agent (>24hrs) due to surgical blood loss or risk of bleeding: not applicable

## 2018-04-28 NOTE — Discharge Summary (Signed)
Obstetrical Discharge Summary  Patient Name: Pamela Robertson DOB: [birth year] MRN: [id number]  Date of Admission: 04/28/2018 Date of Delivery: 04/28/18 Delivered by: Beverly Gust MD Date of Discharge: 04/28/2018  Primary OB: [facility]  LMP:No LMP recorded. Patient is pregnant. EDC Estimated Date of Delivery: 05/02/18 Gestational Age at Delivery: [redacted]w[redacted]d   Antepartum complications:  1. Low PaPP-A 5% 2. BMI 42 pre-preg 3. h/o depression off meds x 96yrs  4. 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 5. Prior C/S   Admitting Diagnosis: previous C/S affecting preg Secondary Diagnosis: s/p repeat LTCS; keloid removal  Patient Active Problem List   Diagnosis Date Noted  . Previous cesarean delivery affecting pregnancy 04/28/2018  . Previous cesarean delivery, antepartum 04/28/2018  . Non-reactive NST (non-stress test) 04/03/2018  . Hyperemesis gravidarum 09/14/2017  . Post-operative state 04/29/2016  . Pregnancy 04/28/2016  . Uterine contractions during pregnancy 04/28/2016  . Family history of neurofibromatosis, type 1 (von Recklinghausen's disease)   . 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 Bipolar I disorder, most recent episode (or current) mixed, moderate 10/06/2012  . Unspecified gastritis and gastroduodenitis without mention of hemorrhage 09/06/2012  . Routine general medical examination at a health care facility 09/04/2012  . Obesity (BMI 30-39.9) 12/23/2011  . 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 Depression   . Menstrual irregularity     Augmentation: n/a Complications: None Intrapartum complications/course: Repeat C/S, excision of keloid Date of Delivery: 04/28/18 at 1125 Delivered By: Beverly Gust MD Delivery Type: repeat cesarean section, low transverse incision Anesthesia: epidural Placenta: spontaneous Laceration: n/a Episiotomy: n/a  Newborn Data: Live born female  Birth Weight:  7#5 APGAR: 8, 9  Newborn Delivery   Birth  date/time:  04/28/2018 11:25:00 Delivery type:  C-Section, Low Transverse Trial of labor:  No C-section categorization:  Repeat    Postpartum Procedures:   Post partum course: s/p Cesarean Section:  Patient had an uncomplicated postpartum course.  By time of discharge on POD#2, her pain was controlled on oral pain medications; she had appropriate lochia and was ambulating, voiding without difficulty, tolerating regular diet and passing flatus.   She was deemed stable for discharge to home.    Discharge Physical Exam: BP 128/89 (BP Location: Right Arm)   Pulse 80   Temp 98.2 F (36.8 C) (Oral)   Resp 18   Ht 5\' 1"  (1.549 m)   Wt 110.7 kg   BMI 46.10 kg/m   General: NAD CV: RRR Pulm: CTABL, nl effort ABD: s/nd/nt, fundus firm and below the umbilicus Lochia: moderate Incision: c/d/i, healing well, no significant drainage, no dehiscence, no significant erythema DVT Evaluation: LE non-ttp, no evidence of DVT on exam.  Hemoglobin  Date Value Ref Range Status  04/28/2018 10.0 (L) 12.0 - 15.0 g/dL Final   HCT  Date Value Ref Range Status  04/28/2018 31.4 (L) 36.0 - 46.0 % Final     Disposition: stable, discharge to home. Baby Feeding:formula Baby Disposition: home with mom  Rh Immune globulin given:  Rubella vaccine given: n/a Varicella vaccine given: n/a Tdap vaccine given in AP setting: 03/09/18 Flu vaccine given in AP setting: 03/09/18  Contraception: plans IUD  Prenatal Labs:  Blood type/Rh  O Pos  Antibody screen neg  Rubella Immune  Varicella Immune  RPR NR  HBsAg Neg  HIV NR  GC neg  Chlamydia neg  Genetic screening negative  1 hour GTT  146  3 hour GTT  not done  GBS  Pos  Plan:  Oliva Bustardyisha Taamai-Lee was discharged to home in good condition. Follow-up appointment with delivering provider in 2 weeks.  Discharge Medications: PNV, Fe, Percocet   Signed: Myrtie Cruisearon W. Nan Maya,RN, MSN, CNM, FNP Certified Nurse Midwife [facility]  [facility]

## 2018-04-28 NOTE — Anesthesia Preprocedure Evaluation (Signed)
Anesthesia Evaluation  Patient identified by MRN, date of birth, ID band Patient awake    Reviewed: Allergy & Precautions, NPO status , Patient's Chart, lab work & pertinent test results, reviewed documented beta blocker date and time   Airway Mallampati: III  TM Distance: >3 FB     Dental  (+) Chipped   Pulmonary former smoker,           Cardiovascular      Neuro/Psych PSYCHIATRIC DISORDERS Anxiety Depression Bipolar Disorder    GI/Hepatic   Endo/Other  Morbid obesity  Renal/GU      Musculoskeletal   Abdominal   Peds  Hematology   Anesthesia Other Findings Hb 10.2.  Reproductive/Obstetrics                             Anesthesia Physical Anesthesia Plan  ASA: III  Anesthesia Plan: Spinal   Post-op Pain Management:    Induction:   PONV Risk Score and Plan:   Airway Management Planned:   Additional Equipment:   Intra-op Plan:   Post-operative Plan:   Informed Consent: I have reviewed the patients History and Physical, chart, labs and discussed the procedure including the risks, benefits and alternatives for the proposed anesthesia with the patient or authorized representative who has indicated his/her understanding and acceptance.     Plan Discussed with: CRNA  Anesthesia Plan Comments:         Anesthesia Quick Evaluation

## 2018-04-28 NOTE — Transfer of Care (Signed)
Immediate Anesthesia Transfer of Care Note  Patient: Pamela Robertson  Procedure(s) Performed: CESAREAN SECTION, repeat (N/A ) EXCISION OF KELOID  Patient Location: PACU and Mother/Baby  Anesthesia Type:Spinal  Level of Consciousness: awake, alert  and oriented  Airway & Oxygen Therapy: Patient Spontanous Breathing  Post-op Assessment: Report given to RN and Post -op Vital signs reviewed and stable  Post vital signs: Reviewed and stable  Last Vitals:  Vitals Value Taken Time  BP 154/92 12:15  Temp 97.39F   Pulse 79   Resp 16   SpO2 99%     Last Pain:  Vitals:   04/28/18 0937  TempSrc: Oral  PainSc:          Complications: No apparent anesthesia complications

## 2018-04-28 NOTE — Anesthesia Procedure Notes (Addendum)
Spinal  Patient location during procedure: OR Start time: 04/28/2018 10:55 AM End time: 04/28/2018 11:06 AM Staffing Resident/CRNA: Dionne Bucy, CRNA Performed: resident/CRNA  Preanesthetic Checklist Completed: patient identified, site marked, surgical consent, pre-op evaluation, timeout performed, IV checked, risks and benefits discussed and monitors and equipment checked Spinal Block Patient position: sitting Prep: ChloraPrep Patient monitoring: heart rate, continuous pulse ox, blood pressure and cardiac monitor Approach: midline Location: L3-4 Injection technique: single-shot Needle Needle type: Introducer and Pencan  Needle gauge: 24 G Needle length: 10 cm Assessment Sensory level: T4 Additional Notes Negative paresthesia. Negative blood return. Positive free-flowing CSF. Expiration date of kit checked and confirmed. Patient tolerated procedure well, without complications.

## 2018-04-28 NOTE — Care Management (Signed)
Initially complained about severe back pain. Now getting better.

## 2018-04-29 ENCOUNTER — Encounter: Payer: Self-pay | Admitting: Obstetrics and Gynecology

## 2018-04-29 LAB — HIV-1/2 AB - DIFFERENTIATION
HIV 1 Ab: NEGATIVE
HIV 2 Ab: NEGATIVE
NOTE (HIV CONF MULTISPOT): NEGATIVE

## 2018-04-29 LAB — CBC
HEMATOCRIT: 25.5 % — AB (ref 36.0–46.0)
Hemoglobin: 8.2 g/dL — ABNORMAL LOW (ref 12.0–15.0)
MCH: 26.1 pg (ref 26.0–34.0)
MCHC: 32.2 g/dL (ref 30.0–36.0)
MCV: 81.2 fL (ref 80.0–100.0)
NRBC: 0 % (ref 0.0–0.2)
PLATELETS: 236 10*3/uL (ref 150–400)
RBC: 3.14 MIL/uL — ABNORMAL LOW (ref 3.87–5.11)
RDW: 15 % (ref 11.5–15.5)
WBC: 11.8 10*3/uL — AB (ref 4.0–10.5)

## 2018-04-29 LAB — RNA QUALITATIVE: HIV 1 RNA Qualitative: 1

## 2018-04-29 NOTE — Progress Notes (Signed)
Subjective: Postpartum Day#1 Cesarean Delivery:POD#1 Patient reports has no pain at the site of surgery  Objective: Vital signs in last 24 hours: Temp:  [97.5 F (36.4 C)-98.4 F (36.9 C)] 98.4 F (36.9 C) (11/27 0338) Pulse Rate:  [53-111] 87 (11/27 0338) Resp:  [0-33] 20 (11/27 0338) BP: (109-163)/(65-103) 109/65 (11/27 0338) SpO2:  [97 %-100 %] 99 % (11/27 0338) Weight:  [110.7 kg] 110.7 kg (11/26 0937)  Physical Exam:  General:A,A& O x3  HEART:S1S2, RRR, NO M/R/G Lochia: Mod,no clots Uterine Fundus:U-1, FF Incision:Elastoplast off, SS under dressing removed, C/D/I DVT Evaluation: Neg Homans  Recent Labs    04/28/18 0852 04/29/18 0534  HGB 10.0* 8.2*  HCT 31.4* 25.5*    Assessment/Plan: Status post Cesarean section.  P: Continue orders _______________________________________________________  Pamela Robertson W Hartlee Amedee 04/29/2018, 9:09 AM

## 2018-04-29 NOTE — Plan of Care (Signed)
Vs stable; up with assistance; foley removed at midnight; pt needs to void by 0800; pt knows to call for assistance up to void; has taken iv toradol for pain control this shift; ambulated prior to foley being removed; elastoplast applied over the original dressing (per day nurse from 04-28-18) and no additional drainage has been noticed this shift; pt now tolerating regular diet; no emesis this shift

## 2018-04-29 NOTE — Anesthesia Postprocedure Evaluation (Signed)
Anesthesia Post Note  Patient: Pamela Robertson  Procedure(s) Performed: CESAREAN SECTION, repeat (N/A ) Removal of cicatrix  Patient location during evaluation: Mother Baby Anesthesia Type: Spinal Level of consciousness: oriented and awake and alert Pain management: pain level controlled Vital Signs Assessment: post-procedure vital signs reviewed and stable Respiratory status: spontaneous breathing and respiratory function stable Cardiovascular status: blood pressure returned to baseline and stable Postop Assessment: no headache, no backache, no apparent nausea or vomiting and able to ambulate Anesthetic complications: no     Last Vitals:  Vitals:   04/29/18 0338 04/29/18 0830  BP: 109/65 106/77  Pulse: 87 78  Resp: 20 18  Temp: 36.9 C 36.7 C  SpO2: 99% 95%    Last Pain:  Vitals:   04/29/18 0830  TempSrc: Oral  PainSc: 0-No pain                 Starling Mannsurtis,  Vashaun Osmon A

## 2018-04-29 NOTE — Anesthesia Post-op Follow-up Note (Signed)
  Anesthesia Pain Follow-up Note  Patient: Pamela Robertson  Day #: 1  Date of Follow-up: 04/29/2018 Time: 1:00 PM  Last Vitals:  Vitals:   04/29/18 0338 04/29/18 0830  BP: 109/65 106/77  Pulse: 87 78  Resp: 20 18  Temp: 36.9 C 36.7 C  SpO2: 99% 95%    Level of Consciousness: alert  Pain: none   Side Effects:None  Catheter Site Exam:clean, dry, no drainage     Plan: D/C from anesthesia care at surgeon's request  Starling MannsCurtis,  Goro Wenrick A

## 2018-04-30 MED ORDER — OXYCODONE HCL 5 MG PO TABS: 5.0000 mg | ORAL_TABLET | ORAL | 0 refills | Status: DC | PRN

## 2018-04-30 NOTE — Progress Notes (Addendum)
Post Partum Day 2/POD#2 LTCS Subjective: I want to go home today  Objective: Blood pressure 111/70, pulse 91, temperature 97.8 F (36.6 C), temperature source Oral, resp. rate 18, height 5\' 1"  (1.549 m), weight 110.7 kg, SpO2 100 %, unknown if currently breastfeeding.  Physical Exam:  General: alert, cooperative and appears stated age Lochia: mod, no clots HEART:S1S2, RRR, NO M/R/G Uterine Fundus:FF, U-2 Incision:C/D/I DVT Evaluation:NEG HOMANS VOIDING WELL, TAKING PO WELL  Recent Labs    04/28/18 0852 04/29/18 0534  HGB 10.0* 8.2*  HCT 31.4* 25.5*    Assessment/Plan: A:1. Pod#2 STABLE 2. ANEMIA P: DC HOME TODAY 2. OTC FE ______________________________________________________ Myrtie Cruisearon W. Lilleigh Hechavarria,RN, MSN, CNM, FNP Certified Nurse Midwife [facility]   LOS: 2 days   Sharee Pimplearon W Ojas Coone 04/30/2018, 6:47 AM

## 2018-04-30 NOTE — Progress Notes (Signed)
Patient discharged home with infant. Discharge instructions, prescriptions and follow up appointment given to and reviewed with patient. Patient verbalized understanding. Wheeled out with infant by NT 

## 2018-05-02 ENCOUNTER — Other Ambulatory Visit: Payer: Self-pay

## 2018-05-02 ENCOUNTER — Emergency Department
Admission: EM | Admit: 2018-05-02 | Discharge: 2018-05-02 | Disposition: A | Discharge status: Home / Self Care | Payer: Medicaid Other | Attending: Emergency Medicine | Admitting: Emergency Medicine

## 2018-05-02 DIAGNOSIS — G8918 Other acute postprocedural pain: Secondary | ICD-10-CM | POA: Insufficient documentation

## 2018-05-02 DIAGNOSIS — O9089 Other complications of the puerperium, not elsewhere classified: Secondary | ICD-10-CM | POA: Insufficient documentation

## 2018-05-02 DIAGNOSIS — Z87891 Personal history of nicotine dependence: Secondary | ICD-10-CM | POA: Diagnosis not present

## 2018-05-02 DIAGNOSIS — Z5189 Encounter for other specified aftercare: Secondary | ICD-10-CM

## 2018-05-02 MED ORDER — CEPHALEXIN 500 MG PO CAPS: 500.0000 mg | ORAL_CAPSULE | Freq: Four times a day (QID) | ORAL | 0 refills | Status: AC

## 2018-05-02 NOTE — ED Notes (Signed)
Pt to the er for possible infection to c section incision. Mild drainage to the far left corner. Minimal swelling and redness. Mild swelling below the incision which appears to be ingrown hairs. Pt reports bilateral leg swelling as well that is nonpitting.

## 2018-05-02 NOTE — Discharge Instructions (Addendum)
Please seek medical attention for any high fevers, chest pain, shortness of breath, change in behavior, persistent vomiting, bloody stool or any other new or concerning symptoms.  

## 2018-05-02 NOTE — ED Triage Notes (Addendum)
Pt comes via POV from home with c/o post op problem. Pt states she just had C-section on November 26th and she believes her incision is open. Pt d/c home on November 28th.  Pt states some right sided burning. Pt also states some yellow pus from incision.  Site checked and no bleeding noted, slight small area that looks to have opened a little and little pus noted.  Pt denies any fever, headache, chills, chest pain and SHOB.

## 2018-05-02 NOTE — ED Provider Notes (Signed)
[facility] Emergency Department Provider Note   ____________________________________________   I have reviewed the triage vital signs and the nursing notes.   HISTORY  Chief Complaint Post-op Problem   History limited by: Not Limited   HPI Pamela Robertson is a 26 y.o. female who presents to the emergency department today with concerns for possible wound dehiscence.  Patient was recently discharged after cesarean delivery.  She states that she thought that it might have been slightly opened it yesterday.  She however is not completely sure.  Today she is concerned that it is open.  She has noticed some drainage and is concerned it might be pus and infected.  She is having some pain to that area although states that the pain medications that she has been taking to help.  She denies any fevers or nausea.  She did notice some right leg swelling while sitting in the waiting room.  She denies any pain to that right leg   Per medical record review patient has a history of recent cesarean section.  Past Medical History:  Diagnosis Date  . Bipolar 1 disorder, depressed, full remission (HCC)    controlled with lamictal and prozac  . Depression 2010   hosp for suical ideation at [facility]   . Menstrual irregularity    occurring every 2 or 3 months    Patient Active Problem List   Diagnosis Date Noted  . Previous cesarean delivery affecting pregnancy 04/28/2018  . Previous cesarean delivery, antepartum 04/28/2018  . Non-reactive NST (non-stress test) 04/03/2018  . Hyperemesis gravidarum 09/14/2017  . Post-operative state 04/29/2016  . Pregnancy 04/28/2016  . Uterine contractions during pregnancy 04/28/2016  . Family history of neurofibromatosis, type 1 (von Recklinghausen's disease)   . 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 Bipolar I disorder, most recent episode (or current) mixed, moderate 10/06/2012  . Unspecified gastritis and gastroduodenitis  without mention of hemorrhage 09/06/2012  . Routine general medical examination at a health care facility 09/04/2012  . Obesity (BMI 30-39.9) 12/23/2011  . 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 Depression   . Menstrual irregularity     Past Surgical History:  Procedure Laterality Date  . CESAREAN SECTION  04/28/2016   Procedure: CESAREAN SECTION;  Surgeon: Suzy Bouchard, MD;  Location: [facility] ORS;  Service: Obstetrics;;  . CESAREAN SECTION N/A 04/28/2018   Procedure: CESAREAN SECTION, repeat;  Surgeon: Suzy Bouchard, MD;  Location: [facility] ORS;  Service: Obstetrics;  Laterality: N/A;  TOB 1125 APGAR 8/9 7lb 5oz  . EXCISION OF KELOID  04/28/2018   Procedure: Removal of cicatrix;  Surgeon: Schermerhorn, Ihor Austin, MD;  Location: [facility] ORS;  Service: Obstetrics;;  . NO PAST SURGERIES      Prior to Admission medications   Medication Sig Start Date End Date Taking? Authorizing Provider  cephALEXin (KEFLEX) 500 MG capsule Take 1 capsule (500 mg total) by mouth 4 (four) times daily for 10 days. 05/02/18 05/12/18  Phineas Semen, MD  oxyCODONE (OXY IR/ROXICODONE) 5 MG immediate release tablet Take 1 tablet (5 mg total) by mouth every 4 (four) hours as needed (pain scale 4-7). 04/30/18   Sharee Pimple, CNM    Allergies Patient has no known allergies.  Family History  Problem Relation Age of Onset  . Diabetes Father     Social History Social History   Tobacco Use  . Smoking status: Former Smoker    Packs/day: 0.10    Types: Cigarettes    Last attempt to  quit: 05/04/2011    Years since quitting: 7.0  . Smokeless tobacco: Never Used  . Tobacco comment: not daily,  less than pack/month  Substance Use Topics  . Alcohol use: No    Comment: last use 06/30/15  . Drug use: No    Review of Systems Constitutional: No fever/chills Eyes: No visual changes. ENT: No sore throat. Cardiovascular: Denies chest pain. Respiratory: Denies shortness of breath. Gastrointestinal:  Positive for abdominal pain Genitourinary: Negative for dysuria. Musculoskeletal: Positive for right leg swelling Skin: Positive for concern for wound dehiscence Neurological: Negative for headaches, focal weakness or numbness.  ____________________________________________   PHYSICAL EXAM:  VITAL SIGNS: ED Triage Vitals  Enc Vitals Group     BP 05/02/18 1957 132/89     Pulse Rate 05/02/18 1957 99     Resp 05/02/18 1957 20     Temp 05/02/18 1957 97.7 F (36.5 C)     Temp src --      SpO2 05/02/18 1957 100 %     Weight 05/02/18 1958 244 lb (110.7 kg)     Height 05/02/18 1958 5\' 1"  (1.549 m)     Head Circumference --      Peak Flow --      Pain Score 05/02/18 1958 3   Constitutional: Alert and oriented.  Eyes: Conjunctivae are normal.  ENT      Head: Normocephalic and atraumatic.      Nose: No congestion/rhinnorhea.      Mouth/Throat: Mucous membranes are moist.      Neck: No stridor. Hematological/Lymphatic/Immunilogical: No cervical lymphadenopathy. Cardiovascular: Normal rate, regular rhythm.  No murmurs, rubs, or gallops. Respiratory: Normal respiratory effort without tachypnea nor retractions. Breath sounds are clear and equal bilaterally. No wheezes/rales/rhonchi. Gastrointestinal: Soft and non tender. No rebound. No guarding.  Genitourinary: Deferred Musculoskeletal: Normal range of motion in all extremities. No lower extremity edema. Neurologic:  Normal speech and language. No gross focal neurologic deficits are appreciated.  Skin: Cesarean section incision is intact.  I do not see any evidence of dehiscence.  There is some granulation tissue and a small amount of serous drainage.  slight area of redness more to the superior mid cesarean section scar Psychiatric: Mood and affect are normal. Speech and behavior are normal. Patient exhibits appropriate insight and judgment.  ____________________________________________    LABS (pertinent  positives/negatives)  None  ____________________________________________   EKG  None  ____________________________________________    RADIOLOGY  None  ____________________________________________   PROCEDURES  Procedures  ____________________________________________   INITIAL IMPRESSION / ASSESSMENT AND PLAN / ED COURSE  Pertinent labs & imaging results that were available during my care of the patient were reviewed by me and considered in my medical decision making (see chart for details).  Patient presented to the emergency department today with concerns for cesarean section dehiscence and infection.  On exam there is no evidence of dehiscence.  I do see some granulation tissue but no obvious pus.  No fluctuance to that area.  Patient does have some erythema to the superior mid cesarean scar.  While this could certainly just be inflammation secondary to the surgery out of abundance of caution will start patient on antibiotics.  Discussed importance of follow-up with OB/GYN.  Additionally discussed return precautions for worsening infection.  ____________________________________________   FINAL CLINICAL IMPRESSION(S) / ED DIAGNOSES  Final diagnoses:  Post-operative pain  Visit for wound check     Note: This dictation was prepared with Dragon dictation. Any transcriptional errors that result  from this process are unintentional     Phineas Semen, MD 05/02/18 2318

## 2020-01-31 ENCOUNTER — Ambulatory Visit: Payer: Medicaid Other | Attending: Internal Medicine

## 2020-01-31 DIAGNOSIS — Z23 Encounter for immunization: Secondary | ICD-10-CM

## 2020-01-31 NOTE — Progress Notes (Signed)
   Covid-19 Vaccination Clinic  Name:  Pamela Robertson    MRN: [id number] DOB: January 20, 1991  01/31/2020  [name] was observed post Covid-19 immunization for 15 minutes without incident. She was provided with Vaccine Information Sheet and instruction to access the V-Safe system.   [name] was instructed to call 911 with any severe reactions post vaccine: Marland Kitchen Difficulty breathing  . Swelling of face and throat  . A fast heartbeat  . A bad rash all over body  . Dizziness and weakness   Immunizations Administered    Name Date Dose VIS Date Route   Pfizer COVID-19 Vaccine 01/31/2020  9:39 AM 0.3 mL 07/28/2018 Intramuscular   Manufacturer: [employer], [employer]   Lot: [id number]   NDC: [address]-1

## 2020-02-21 ENCOUNTER — Ambulatory Visit: Payer: Medicaid Other | Attending: Internal Medicine

## 2020-02-21 DIAGNOSIS — Z23 Encounter for immunization: Secondary | ICD-10-CM

## 2020-02-21 NOTE — Progress Notes (Signed)
   Covid-19 Vaccination Clinic  Name:  Pamela Robertson    MRN: [id number] DOB: [birth year]  02/21/2020  [name] was observed post Covid-19 immunization for 15 minutes without incident. She was provided with Vaccine Information Sheet and instruction to access the V-Safe system.   [name] was instructed to call 911 with any severe reactions post vaccine: Marland Kitchen Difficulty breathing  . Swelling of face and throat  . A fast heartbeat  . A bad rash all over body  . Dizziness and weakness   Immunizations Administered    Name Date Dose VIS Date Route   Pfizer COVID-19 Vaccine 02/21/2020  4:01 PM 0.3 mL 07/28/2018 Intramuscular   Manufacturer: [employer], [employer]   Lot: [id number]   NDC: [address]-3

## 2020-03-03 IMAGING — US US OB COMP LESS 14 WK
1 series · 14 of 28 positions shown · non-contrast
Comparison: None.

CLINICAL DATA: Hyperemesis gravidarum

EXAM:
OBSTETRIC <14 WK ULTRASOUND
TECHNIQUE: Transabdominal ultrasound was performed for evaluation of the
gestation as well as the maternal uterus and adnexal regions.
Patient refused transabdominal examination

[Series 1: us ob comp less 14 wk · 69 acquisitions, 14 frames shown]
[im 3/69]
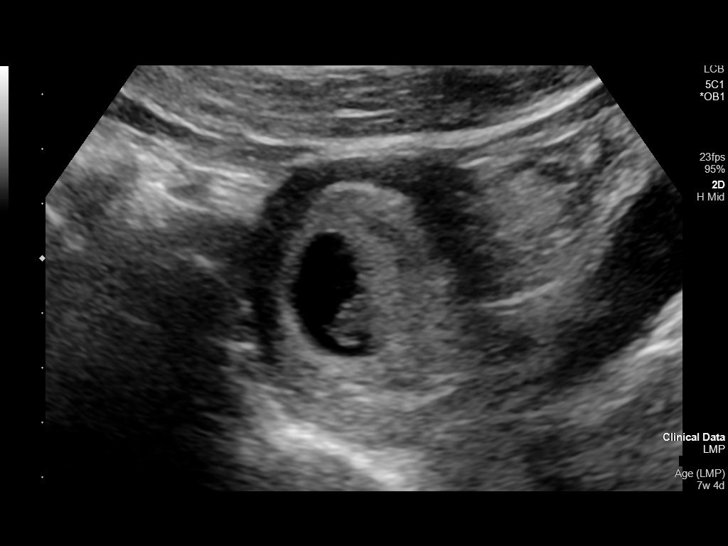
[im 8/69]
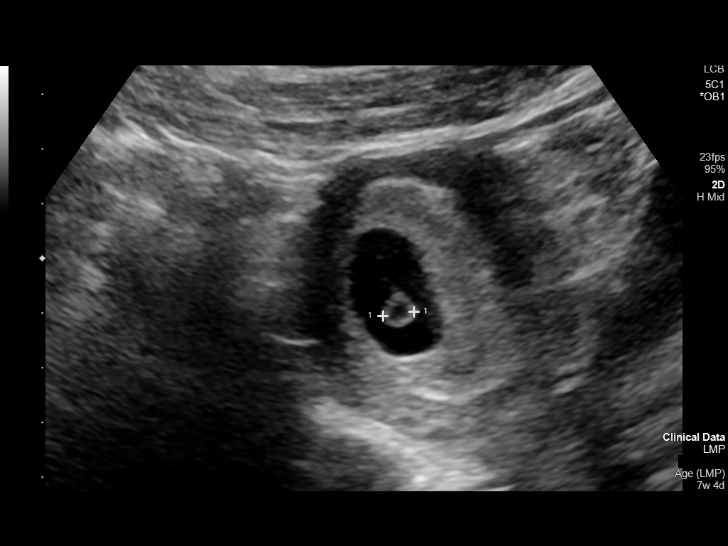
[im 13/69]
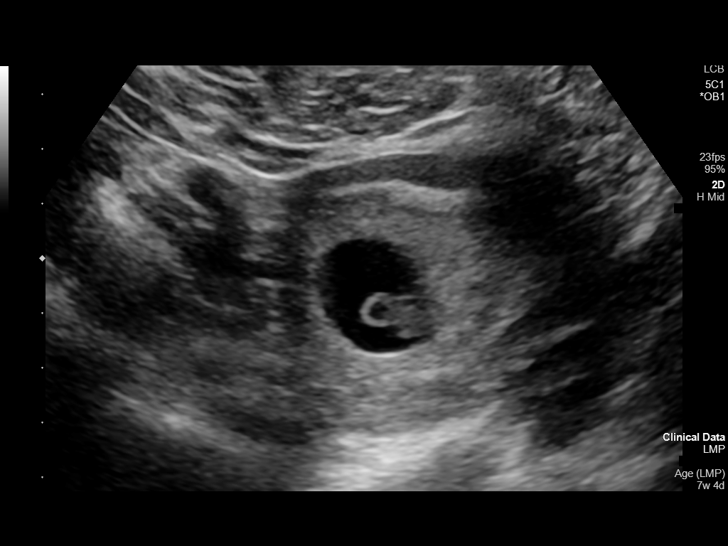
[im 18/69]
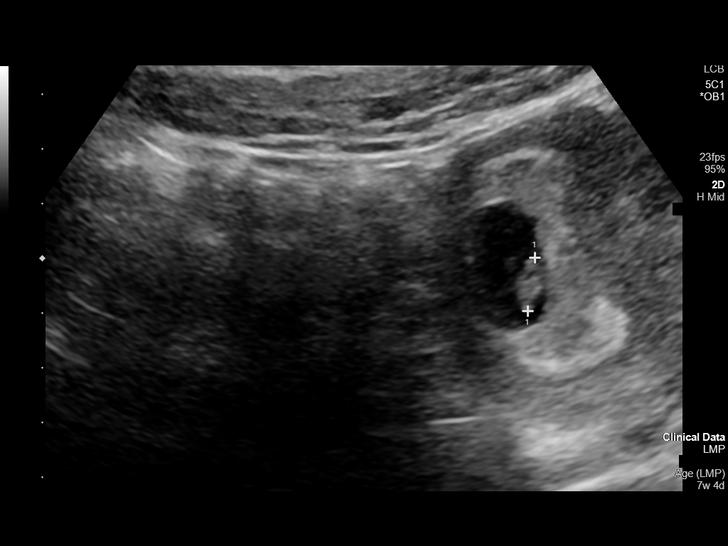
[im 23/69]
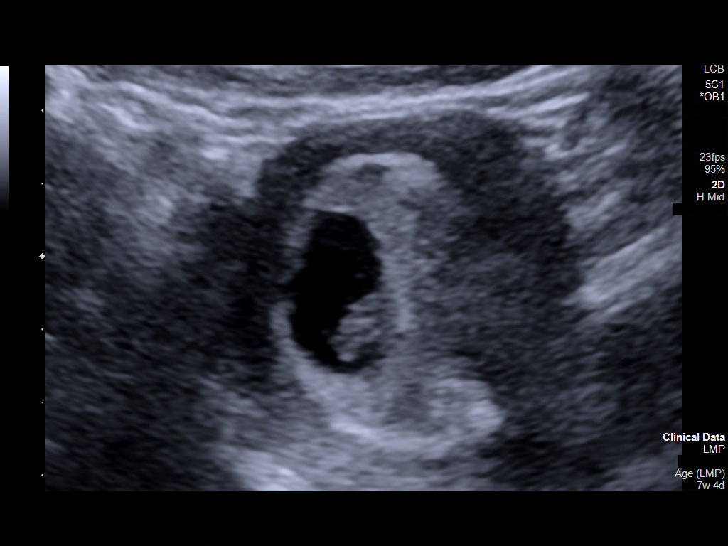
[im 28/69]
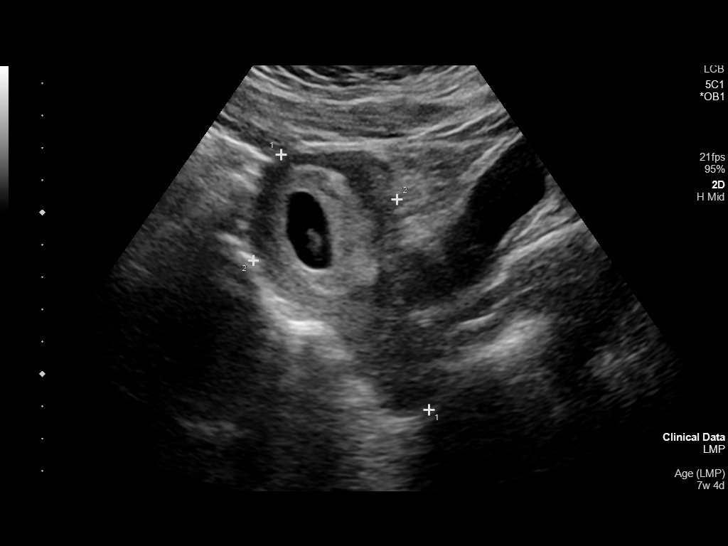
[im 33/69]
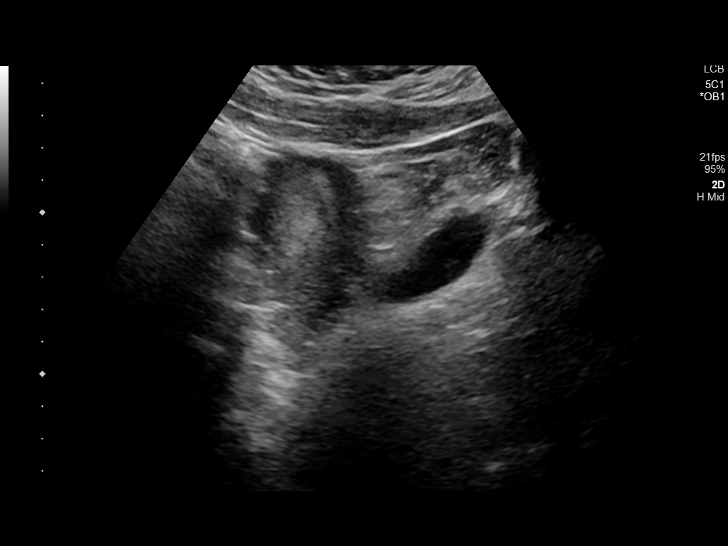
[im 38/69]
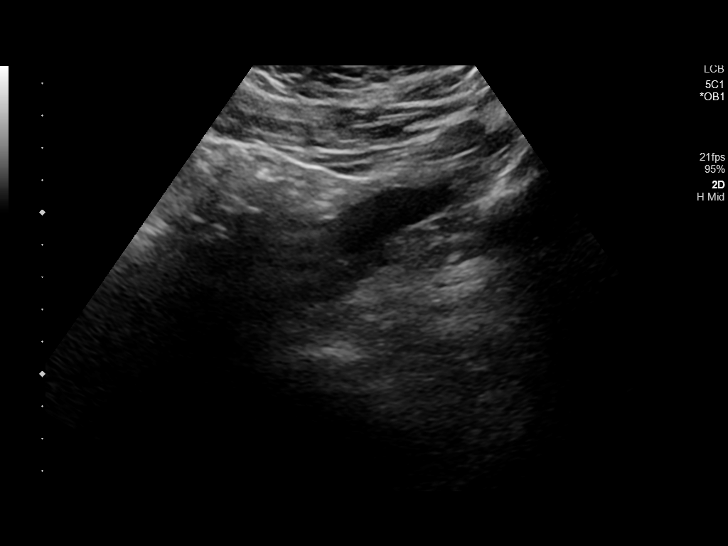
[im 43/69]
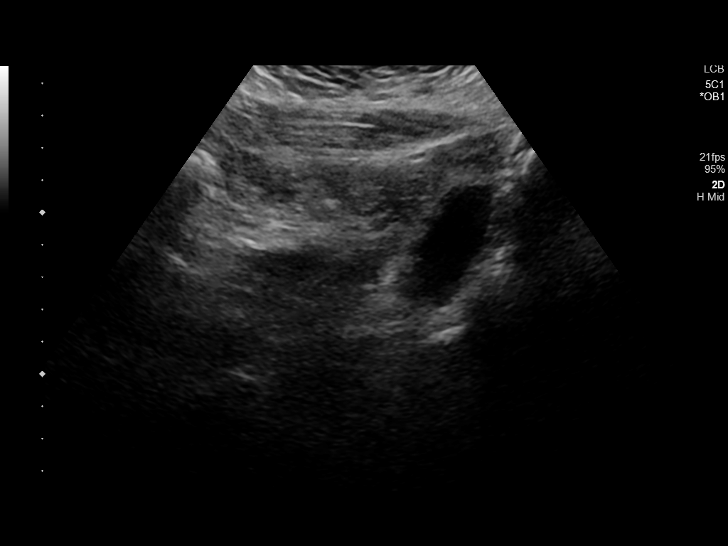
[im 48/69]
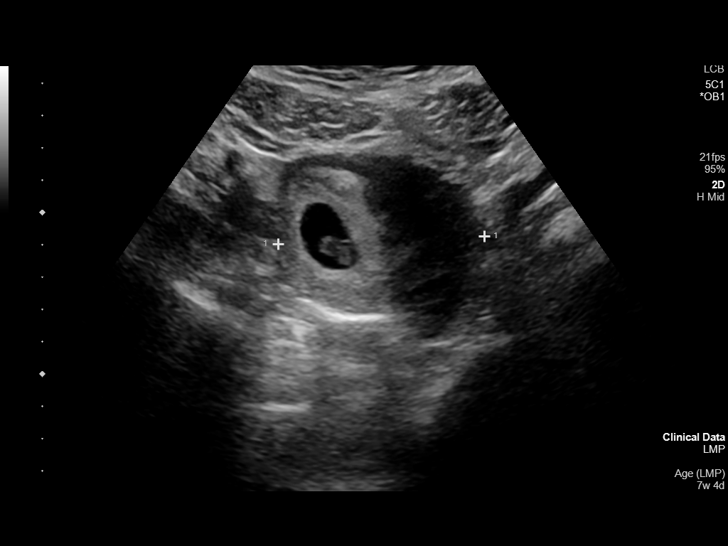
[im 53/69]
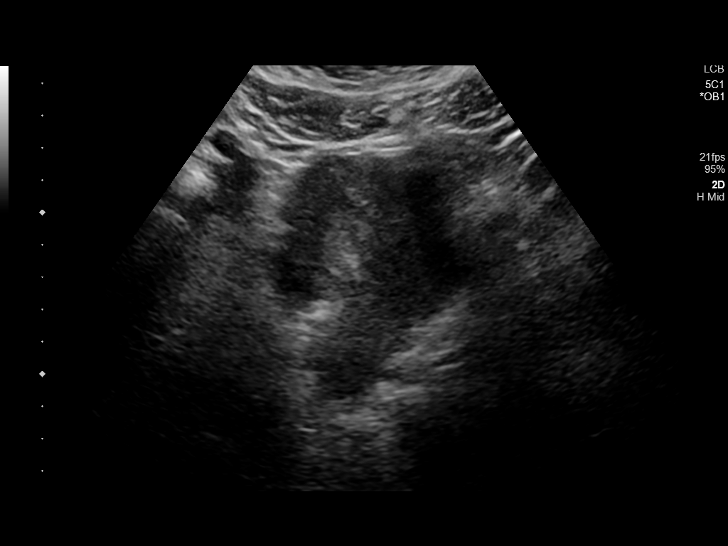
[im 58/69]
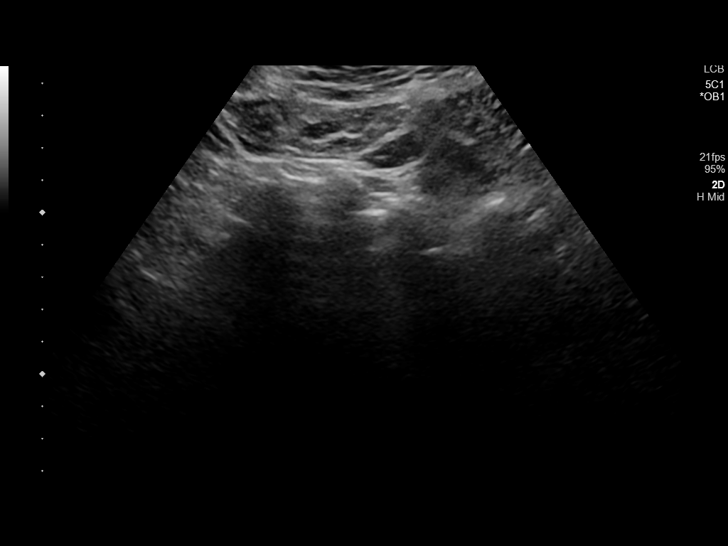
[im 63/69]
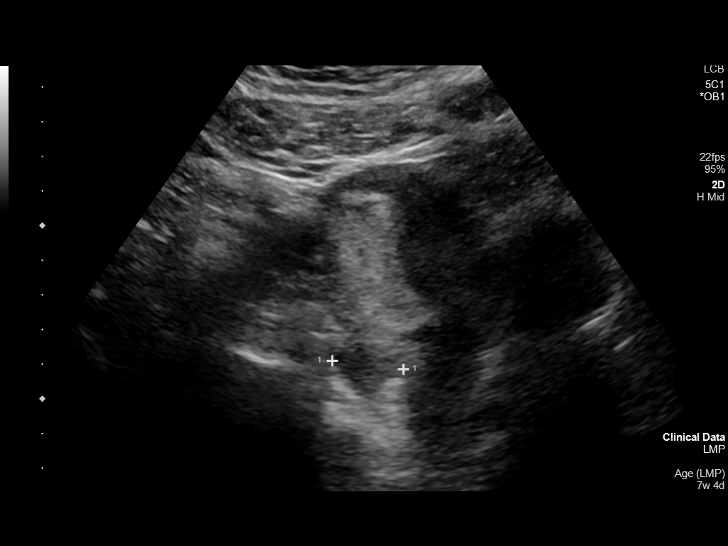
[im 69/69]
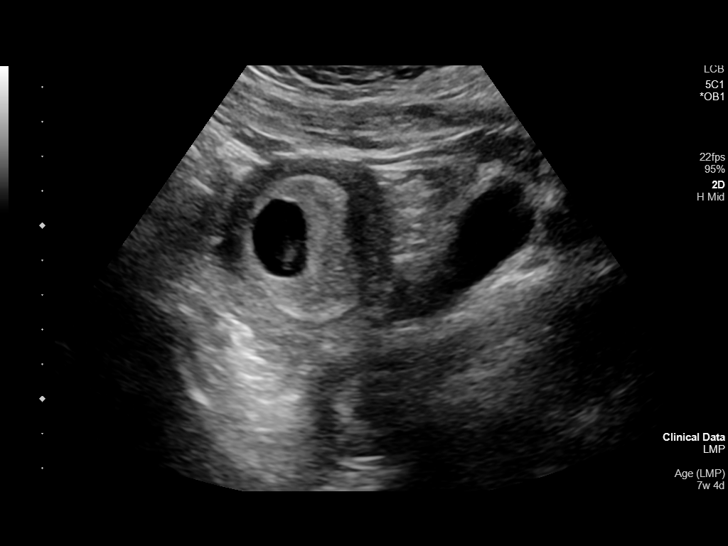

[14 of 28 positions shown; findings below may reference images not displayed]

FINDINGS: Intrauterine gestational sac: Visualized

Yolk sac:  Visualized

Embryo:  Visualized

Cardiac Activity: Visualized

Heart Rate: 149 bpm

CRL:   10 mm   7 w 0 d                  US EDC: May 03, 2018

Subchorionic hemorrhage:  None visualized.

Maternal uterus/adnexae: Cervical os is closed. Right ovary measures
2.5 x 1.5 x 2.1 cm. Left ovary measures 1.9 x 2.7 x 2.2 cm. There is
no extrauterine pelvic or adnexal mass. No free pelvic fluid.
IMPRESSION: Single live intrauterine gestation with estimated gestational age of
7 weeks. Study otherwise unremarkable.

## 2020-05-03 LAB — OB RESULTS CONSOLE HIV ANTIBODY (ROUTINE TESTING): HIV: NONREACTIVE

## 2020-05-03 LAB — OB RESULTS CONSOLE VARICELLA ZOSTER ANTIBODY, IGG: Varicella: IMMUNE

## 2020-05-03 LAB — OB RESULTS CONSOLE RUBELLA ANTIBODY, IGM: Rubella: IMMUNE

## 2020-05-03 LAB — OB RESULTS CONSOLE HEPATITIS B SURFACE ANTIGEN: Hepatitis B Surface Ag: NEGATIVE

## 2020-09-11 ENCOUNTER — Encounter
Admission: RE | Admit: 2020-09-11 | Discharge: 2020-09-11 | Disposition: A | Discharge status: Home / Self Care | Payer: Medicaid Other | Source: Ambulatory Visit | Attending: Anesthesiology | Admitting: Anesthesiology

## 2020-09-11 ENCOUNTER — Other Ambulatory Visit: Payer: Self-pay

## 2020-09-11 NOTE — Consult Note (Signed)
[facility] Anesthesia Consultation  Priti Consoli JOI:[id number] DOB: Dec 03, 1990 DOA: 09/11/2020 PCP: Rayetta Humphrey, MD   Requesting physician: Dr. Feliberto Gottron Date of consultation: 09/11/20 Reason for consultation: Obesity during pregnancy  CHIEF COMPLAINT:  Obesity during pregnancy  HISTORY OF PRESENT ILLNESS: Britt Theard  is a 30 y.o. female with a known history of obesity during pregnancy. She has had 2 prior cesarean deliveries, planning for repeat cesarean delivery. States she has had high BP outside of pregnancy but not on medications, BP has been normal during pregnancy. Denies hx of asthma. Denies personal or family hx of bleeding disorders.   PAST MEDICAL HISTORY:   Past Medical History:  Diagnosis Date  . Bipolar 1 disorder, depressed, full remission (HCC)    controlled with lamictal and prozac  . Depression 2010   hosp for suical ideation at [facility]   . Menstrual irregularity    occurring every 2 or 3 months    PAST SURGICAL HISTORY:  Past Surgical History:  Procedure Laterality Date  . CESAREAN SECTION  04/28/2016   Procedure: CESAREAN SECTION;  Surgeon: Suzy Bouchard, MD;  Location: [facility] ORS;  Service: Obstetrics;;  . CESAREAN SECTION N/A 04/28/2018   Procedure: CESAREAN SECTION, repeat;  Surgeon: Suzy Bouchard, MD;  Location: [facility] ORS;  Service: Obstetrics;  Laterality: N/A;  TOB 1125 APGAR 8/9 7lb 5oz  . EXCISION OF KELOID  04/28/2018   Procedure: Removal of cicatrix;  Surgeon: Schermerhorn, Ihor Austin, MD;  Location: [facility] ORS;  Service: Obstetrics;;  . NO PAST SURGERIES      SOCIAL HISTORY:  Social History   Tobacco Use  . Smoking status: Former Smoker    Packs/day: 0.10    Types: Cigarettes    Quit date: 05/04/2011    Years since quitting: 9.3  . Smokeless tobacco: Never Used  . Tobacco comment: not daily,  less than pack/month  Substance Use Topics  . Alcohol use: No    Comment:  last use 06/30/15    FAMILY HISTORY:  Family History  Problem Relation Age of Onset  . Diabetes Father     DRUG ALLERGIES: No Known Allergies  REVIEW OF SYSTEMS:   RESPIRATORY: No cough, shortness of breath, wheezing.  CARDIOVASCULAR: No chest pain, orthopnea, edema.  HEMATOLOGY: No anemia, easy bruising or bleeding SKIN: No rash or lesion. NEUROLOGIC: No tingling, numbness, weakness.  PSYCHIATRY: No anxiety or depression.   MEDICATIONS AT HOME:  Prior to Admission medications   Medication Sig Start Date End Date Taking? Authorizing Provider  oxyCODONE (OXY IR/ROXICODONE) 5 MG immediate release tablet Take 1 tablet (5 mg total) by mouth every 4 (four) hours as needed (pain scale 4-7). 04/30/18   Sharee Pimple, CNM      PHYSICAL EXAMINATION:   VITAL SIGNS: unknown if currently breastfeeding.  GENERAL:  31 y.o.-year-old patient no acute distress.  HEENT: Head atraumatic, normocephalic. Oropharynx and nasopharynx clear. MP 2, TM distance >3 cm, normal mouth opening, grade 1 upper lip bite LUNGS: No use of accessory muscles of respiration.   EXTREMITIES: No pedal edema, cyanosis, or clubbing.  NEUROLOGIC: normal gait PSYCHIATRIC: The patient is alert and oriented x 3.  SKIN: No obvious rash, lesion, or ulcer.    IMPRESSION AND PLAN:   Tianni Escamilla  is a 30 y.o. female presenting with obesity during pregnancy. BMI is currently 52 at [redacted] weeks gestation.   Airway exam reassuring. Significant back adiposity, spinal interspaces minimally palpable.   Discussed spinal anesthesia for  planned cesarean delivery. Discussed spinal vs GA if emergent cesarean delivery is required. Discussed increased risk of difficult intubation during pregnancy should an emergency cesarean delivery be required.   Plan for delivery at [facility].

## 2020-10-16 LAB — OB RESULTS CONSOLE GC/CHLAMYDIA
Chlamydia: NEGATIVE
Gonorrhea: NEGATIVE

## 2020-10-16 NOTE — H&P (Signed)
Pamela Robertson is a 30 y.o. female presenting for repeat LTCS and BTL on 11/06/20.EDC 11/13/20  Pregnancy problems : FOB with neurofibromotosistype 1 Obesity  Lapse of care  Mental health  Prior c/s x 2  OB History as of 04/30/2018    Gravida  2   Para  2   Term  2   Preterm      AB      Living  2     SAB      IAB      Ectopic      Multiple  0   Live Births  2          Past Medical History:  Diagnosis Date  . Bipolar 1 disorder, depressed, full remission (HCC)    controlled with lamictal and prozac  . Depression 2010   hosp for suical ideation at [facility]   . Menstrual irregularity    occurring every 2 or 3 months   Past Surgical History:  Procedure Laterality Date  . CESAREAN SECTION  04/28/2016   Procedure: CESAREAN SECTION;  Surgeon: Suzy Bouchard, MD;  Location: [facility] ORS;  Service: Obstetrics;;  . CESAREAN SECTION N/A 04/28/2018   Procedure: CESAREAN SECTION, repeat;  Surgeon: Suzy Bouchard, MD;  Location: [facility] ORS;  Service: Obstetrics;  Laterality: N/A;  TOB 1125 APGAR 8/9 7lb 5oz  . EXCISION OF KELOID  04/28/2018   Procedure: Removal of cicatrix;  Surgeon: Cain Fitzhenry, Ihor Austin, MD;  Location: [facility] ORS;  Service: Obstetrics;;  . NO PAST SURGERIES     Family History: family history includes Diabetes in her father. Social History:  reports that she quit smoking about 9 years ago. Her smoking use included cigarettes. She smoked 0.10 packs per day. She has never used smokeless tobacco. She reports that she does not drink alcohol and does not use drugs.     Maternal Diabetes: No Genetic Screening: Normal Maternal Ultrasounds/Referrals: Normal Fetal Ultrasounds or other Referrals:  None Maternal Substance Abuse:  No Significant Maternal Medications:  None Significant Maternal Lab Results:  None Other Comments:  None  Review of Systems History   Blood pressure 117/73, pulse (!) 102, temperature 98 F (36.7 C), temperature  source Oral, resp. rate 16, unknown if currently breastfeeding. Exam Physical Exam  Prenatal labs: ABO, Rh:  O+ Antibody:  neg Rubella:  imm / Varicella Imm RPR:   NR HBsAg:  neg  HIV:   neg GBS:     Assessment/Plan: Elective repeat LTCS + BTL at 39 weeks 6/6//22 Risk have been discussed . All questions answered    Ihor Austin Jomes Giraldo 10/16/2020, 11:02 AM

## 2020-10-18 NOTE — H&P (Signed)
  Pamela Robertson is a 30 y.o. female presenting for repeat LTCS and BTL on 11/06/20.EDC 11/13/20  Pregnancy problems : FOB with neurofibromotosistype 1 Obesity  Lapse of care  Mental health  Prior c/s x 2          OB History as of 04/30/2018    Gravida  2   Para  2   Term  2   Preterm      AB      Living  2     SAB      IAB      Ectopic      Multiple  0   Live Births  2              Past Medical History:  Diagnosis Date  . Bipolar 1 disorder, depressed, full remission (HCC)    controlled with lamictal and prozac  . Depression 2010   hosp for suical ideation at [facility]   . Menstrual irregularity    occurring every 2 or 3 months        Past Surgical History:  Procedure Laterality Date  . CESAREAN SECTION  04/28/2016   Procedure: CESAREAN SECTION;  Surgeon: Suzy Bouchard, MD;  Location: [facility] ORS;  Service: Obstetrics;;  . CESAREAN SECTION N/A 04/28/2018   Procedure: CESAREAN SECTION, repeat;  Surgeon: Suzy Bouchard, MD;  Location: [facility] ORS;  Service: Obstetrics;  Laterality: N/A;  TOB 1125 APGAR 8/9 7lb 5oz  . EXCISION OF KELOID  04/28/2018   Procedure: Removal of cicatrix;  Surgeon: Phoebe Marter, Ihor Austin, MD;  Location: [facility] ORS;  Service: Obstetrics;;  . NO PAST SURGERIES     Family History: family history includes Diabetes in her father. Social History:  reports that she quit smoking about 9 years ago. Her smoking use included cigarettes. She smoked 0.10 packs per day. She has never used smokeless tobacco. She reports that she does not drink alcohol and does not use drugs.     Maternal Diabetes: No Genetic Screening: Normal Maternal Ultrasounds/Referrals: Normal Fetal Ultrasounds or other Referrals:  None Maternal Substance Abuse:  No Significant Maternal Medications:  None Significant Maternal Lab Results:  None Other Comments:  None  Review of Systems History Blood pressure 117/73, pulse (!) 102,  temperature 98 F (36.7 C), temperature source Oral, resp. rate 16, unknown if currently breastfeeding. Exam Physical Exam  Lungs CTA   CV RRR Abd: gravid Prenatal labs: ABO, Rh:  O+ Antibody:  neg Rubella:  imm / Varicella Imm RPR:   NR HBsAg:  neg  HIV:   neg GBS:     Assessment/Plan: Elective repeat LTCS + BTL at 39 weeks 6/6//22 Risk have been discussed . All questions answered               Ihor Austin Tracy Gerken 10/16/2020, 11:02 AM

## 2020-10-23 LAB — OB RESULTS CONSOLE RPR: RPR: NONREACTIVE

## 2020-10-24 ENCOUNTER — Inpatient Hospital Stay: Admission: RE | Admit: 2020-10-24 | Payer: Medicaid Other | Source: Ambulatory Visit

## 2020-11-01 ENCOUNTER — Encounter: Payer: Self-pay | Admitting: *Deleted

## 2020-11-01 ENCOUNTER — Other Ambulatory Visit: Payer: Self-pay

## 2020-11-01 ENCOUNTER — Encounter
Admission: RE | Admit: 2020-11-01 | Discharge: 2020-11-01 | Disposition: A | Discharge status: Home / Self Care | Payer: Medicaid Other | Source: Ambulatory Visit | Attending: Obstetrics and Gynecology | Admitting: Obstetrics and Gynecology

## 2020-11-01 HISTORY — DX: Anemia, unspecified: D64.9

## 2020-11-01 NOTE — Patient Instructions (Signed)
Your procedure is scheduled on:11-06-20 MONDAY Report to the Registration Desk on the 1st floor of the [facility]-Then proceed to the 3rd floor to Labor and Delivery. Arrive @ 10 am   REMEMBER: Instructions that are not followed completely may result in serious medical risk, up to and including death; or upon the discretion of your surgeon and anesthesiologist your surgery may need to be rescheduled.  Do not eat food after midnight the night before surgery.  No gum chewing, lozengers or hard candies.  You may however, drink CLEAR liquids up to 2 hours before you are scheduled to arrive for your surgery. Do not drink anything within 2 hours of your scheduled arrival time.  Clear liquids include: - water  - apple juice without pulp - gatorade  - black coffee or tea (Do NOT add milk or creamers to the coffee or tea) Do NOT drink anything that is not on this list.  DO NOT TAKE ANY MEDICATION THE DAY OF YOUR SURGERY  CALL DR SCHERMERHORN'S OFFICE TODAY 11-01-20 TO FIND OUT WHEN YOU NEED TO STOP YOUR 81 MG ASPIRIN  One week prior to surgery: Stop Anti-inflammatories (NSAIDS) such as Advil, Aleve, Ibuprofen, Motrin, Naproxen, Naprosyn and Aspirin based products such as Excedrin, Goodys Powder, BC Powder.You may however, continue to take Tylenol if needed for pain up until the day of surgery.  Stop ANY OVER THE COUNTER supplements until after surgery-HOWEVER, CONTINUE YOUR PRENATAL VITAMIN UP UNTIL THE DAY PRIOR TO SURGERY   No Alcohol for 24 hours before or after surgery.  No Smoking including e-cigarettes for 24 hours prior to surgery.  No chewable tobacco products for at least 6 hours prior to surgery.  No nicotine patches on the day of surgery.  Do not use any "recreational" drugs for at least a week prior to your surgery.  Please be advised that the combination of cocaine and anesthesia may have negative outcomes, up to and including death. If you test positive for cocaine, your  surgery will be cancelled.  On the morning of surgery brush your teeth with toothpaste and water, you may rinse your mouth with mouthwash if you wish. Do not swallow any toothpaste or mouthwash.  Do not wear jewelry, make-up, hairpins, clips or nail polish.  Do not wear lotions, powders, or perfumes.   Do not shave body from the neck down 48 hours prior to surgery just in case you cut yourself which could leave a site for infection.  Also, freshly shaved skin may become irritated if using the CHG soap.  Contact lenses, hearing aids and dentures may not be worn into surgery.  Do not bring valuables to the hospital. [facility] is not responsible for any missing/lost belongings or valuables.   Use CHG Soap as directed on instruction sheet.  Notify your doctor if there is any change in your medical condition (cold, fever, infection).  Wear comfortable clothing (specific to your surgery type) to the hospital.  Plan for stool softeners for home use; pain medications have a tendency to cause constipation. You can also help prevent constipation by eating foods high in fiber such as fruits and vegetables and drinking plenty of fluids as your diet allows.  After surgery, you can help prevent lung complications by doing breathing exercises.  Take deep breaths and cough every 1-2 hours. Your doctor may order a device called an Incentive Spirometer to help you take deep breaths. When coughing or sneezing, hold a pillow firmly against your incision with both  hands. This is called "splinting." Doing this helps protect your incision. It also decreases belly discomfort.  If you are being admitted to the hospital overnight, leave your suitcase in the car. After surgery it may be brought to your room.  If you are being discharged the day of surgery, you will not be allowed to drive home. You will need a responsible adult (18 years or older) to drive you home and stay with you that night.   If you  are taking public transportation, you will need to have a responsible adult (18 years or older) with you. Please confirm with your physician that it is acceptable to use public transportation.   Please call the Pre-admissions Testing Dept. at [phone] if you have any questions about these instructions.  Surgery Visitation Policy:  Patients undergoing a surgery or procedure may have one family member or support person with them as long as that person is not COVID-19 positive or experiencing its symptoms.  That person may remain in the waiting area during the procedure.  Inpatient Visitation:    Visiting hours are 7 a.m. to 8 p.m. Inpatients will be allowed two visitors daily. The visitors may change each day during the patient's stay. No visitors under the age of 45. Any visitor under the age of 66 must be accompanied by an adult. The visitor must pass COVID-19 screenings, use hand sanitizer when entering and exiting the patient's room and wear a mask at all times, including in the patient's room. Patients must also wear a mask when staff or their visitor are in the room. Masking is required regardless of vaccination status.

## 2020-11-03 ENCOUNTER — Other Ambulatory Visit: Payer: Medicaid Other

## 2020-11-03 ENCOUNTER — Encounter
Admission: RE | Admit: 2020-11-03 | Discharge: 2020-11-03 | Disposition: A | Discharge status: Home / Self Care | Payer: Medicaid Other | Source: Ambulatory Visit | Attending: Obstetrics and Gynecology | Admitting: Obstetrics and Gynecology

## 2020-11-03 ENCOUNTER — Other Ambulatory Visit: Payer: Self-pay

## 2020-11-03 DIAGNOSIS — Z01812 Encounter for preprocedural laboratory examination: Secondary | ICD-10-CM | POA: Diagnosis not present

## 2020-11-03 DIAGNOSIS — Z20822 Contact with and (suspected) exposure to covid-19: Secondary | ICD-10-CM | POA: Diagnosis not present

## 2020-11-03 LAB — BASIC METABOLIC PANEL
Anion gap: 10 (ref 5–15)
BUN: 10 mg/dL (ref 6–20)
CO2: 20 mmol/L — ABNORMAL LOW (ref 22–32)
Calcium: 8.6 mg/dL — ABNORMAL LOW (ref 8.9–10.3)
Chloride: 108 mmol/L (ref 98–111)
Creatinine, Ser: 0.69 mg/dL (ref 0.44–1.00)
GFR, Estimated: >60 mL/min (ref >60)
Glucose, Bld: 76 mg/dL (ref 70–99)
Potassium: 3.7 mmol/L (ref 3.5–5.1)
Sodium: 138 mmol/L (ref 135–145)

## 2020-11-03 LAB — CBC
HCT: 32.2 % — ABNORMAL LOW (ref 36.0–46.0)
Hemoglobin: 10.3 g/dL — ABNORMAL LOW (ref 12.0–15.0)
MCH: 25.2 pg — ABNORMAL LOW (ref 26.0–34.0)
MCHC: 32 g/dL (ref 30.0–36.0)
MCV: 78.9 fL — ABNORMAL LOW (ref 80.0–100.0)
Platelets: 268 10*3/uL (ref 150–400)
RBC: 4.08 MIL/uL (ref 3.87–5.11)
RDW: 15.3 % (ref 11.5–15.5)
WBC: 8.1 10*3/uL (ref 4.0–10.5)
nRBC: 0 % (ref 0.0–0.2)

## 2020-11-03 LAB — TYPE AND SCREEN
ABO/RH(D): O POS
Antibody Screen: NEGATIVE
Extend sample reason: UNDETERMINED

## 2020-11-03 LAB — SARS CORONAVIRUS 2 (TAT 6-24 HRS): SARS Coronavirus 2: NEGATIVE

## 2020-11-06 ENCOUNTER — Encounter
Admission: RE | Disposition: A | Discharge status: Home / Self Care | Payer: Self-pay | Source: Home / Self Care | Attending: Obstetrics and Gynecology

## 2020-11-06 ENCOUNTER — Inpatient Hospital Stay: Payer: Medicaid Other | Admitting: Anesthesiology

## 2020-11-06 ENCOUNTER — Inpatient Hospital Stay
Admission: RE | Admit: 2020-11-06 | Discharge: 2020-11-08 | DRG: 785 | Disposition: A | Discharge status: Home / Self Care | Payer: Medicaid Other | Attending: Obstetrics and Gynecology | Admitting: Obstetrics and Gynecology

## 2020-11-06 ENCOUNTER — Other Ambulatory Visit: Payer: Self-pay

## 2020-11-06 ENCOUNTER — Encounter: Payer: Self-pay | Admitting: Obstetrics and Gynecology

## 2020-11-06 DIAGNOSIS — O34219 Maternal care for unspecified type scar from previous cesarean delivery: Secondary | ICD-10-CM | POA: Diagnosis present

## 2020-11-06 DIAGNOSIS — Z302 Encounter for sterilization: Secondary | ICD-10-CM

## 2020-11-06 DIAGNOSIS — D509 Iron deficiency anemia, unspecified: Secondary | ICD-10-CM | POA: Diagnosis present

## 2020-11-06 DIAGNOSIS — O99214 Obesity complicating childbirth: Secondary | ICD-10-CM | POA: Diagnosis present

## 2020-11-06 DIAGNOSIS — Z3A39 39 weeks gestation of pregnancy: Secondary | ICD-10-CM

## 2020-11-06 DIAGNOSIS — F3176 Bipolar disorder, in full remission, most recent episode depressed: Secondary | ICD-10-CM | POA: Diagnosis present

## 2020-11-06 DIAGNOSIS — O99344 Other mental disorders complicating childbirth: Secondary | ICD-10-CM | POA: Diagnosis present

## 2020-11-06 DIAGNOSIS — Z87891 Personal history of nicotine dependence: Secondary | ICD-10-CM

## 2020-11-06 DIAGNOSIS — O9902 Anemia complicating childbirth: Secondary | ICD-10-CM | POA: Diagnosis present

## 2020-11-06 DIAGNOSIS — Z9889 Other specified postprocedural states: Secondary | ICD-10-CM

## 2020-11-06 DIAGNOSIS — O34211 Maternal care for low transverse scar from previous cesarean delivery: Secondary | ICD-10-CM | POA: Diagnosis present

## 2020-11-06 SURGERY — Surgical Case
Anesthesia: Spinal | Laterality: Bilateral

## 2020-11-06 MED ORDER — BUPIVACAINE IN DEXTROSE 0.75-8.25 % IT SOLN
INTRATHECAL | Status: DC | PRN
  Administered 2020-11-06: 1.5 mL via INTRATHECAL

## 2020-11-06 MED ORDER — SENNOSIDES-DOCUSATE SODIUM 8.6-50 MG PO TABS
2.0000 | ORAL_TABLET | Freq: Every day | ORAL | Status: DC
  Administered 2020-11-07: 2 via ORAL
  Filled 2020-11-06: qty 2

## 2020-11-06 MED ORDER — FENTANYL CITRATE (PF) 100 MCG/2ML IJ SOLN
INTRAMUSCULAR | Status: DC | PRN
  Administered 2020-11-06: 15 ug via INTRATHECAL

## 2020-11-06 MED ORDER — SIMETHICONE 80 MG PO CHEW
80.0000 mg | CHEWABLE_TABLET | ORAL | Status: DC | PRN
  Administered 2020-11-07 – 2020-11-08 (×2): 80 mg via ORAL
  Filled 2020-11-06: qty 1

## 2020-11-06 MED ORDER — WITCH HAZEL-GLYCERIN EX PADS: 1.0000 "application " | MEDICATED_PAD | CUTANEOUS | Status: DC | PRN

## 2020-11-06 MED ORDER — MEPERIDINE HCL 25 MG/ML IJ SOLN: 6.2500 mg | INTRAMUSCULAR | Status: DC | PRN

## 2020-11-06 MED ORDER — ACETAMINOPHEN 500 MG PO TABS: 1000.0000 mg | ORAL_TABLET | Freq: Four times a day (QID) | ORAL | Status: DC

## 2020-11-06 MED ORDER — ENOXAPARIN SODIUM 40 MG/0.4ML IJ SOSY
40.0000 mg | PREFILLED_SYRINGE | INTRAMUSCULAR | Status: DC
  Administered 2020-11-07: 40 mg via SUBCUTANEOUS
  Filled 2020-11-06: qty 0.4

## 2020-11-06 MED ORDER — ONDANSETRON HCL 4 MG/2ML IJ SOLN: 4.0000 mg | Freq: Three times a day (TID) | INTRAMUSCULAR | Status: DC | PRN

## 2020-11-06 MED ORDER — BUPIVACAINE HCL (PF) 0.5 % IJ SOLN
INTRAMUSCULAR | Status: AC
  Filled 2020-11-06: qty 30

## 2020-11-06 MED ORDER — DIBUCAINE (PERIANAL) 1 % EX OINT: 1.0000 "application " | TOPICAL_OINTMENT | CUTANEOUS | Status: DC | PRN

## 2020-11-06 MED ORDER — ACETAMINOPHEN 500 MG PO TABS
1000.0000 mg | ORAL_TABLET | Freq: Four times a day (QID) | ORAL | Status: DC
  Administered 2020-11-06 – 2020-11-08 (×6): 1000 mg via ORAL
  Filled 2020-11-06 (×6): qty 2

## 2020-11-06 MED ORDER — CARBOPROST TROMETHAMINE 250 MCG/ML IM SOLN
INTRAMUSCULAR | Status: AC
  Filled 2020-11-06: qty 1

## 2020-11-06 MED ORDER — NALBUPHINE HCL 10 MG/ML IJ SOLN: 5.0000 mg | INTRAMUSCULAR | Status: DC | PRN

## 2020-11-06 MED ORDER — MENTHOL 3 MG MT LOZG
1.0000 | LOZENGE | OROMUCOSAL | Status: DC | PRN
  Filled 2020-11-06: qty 9

## 2020-11-06 MED ORDER — LACTATED RINGERS IV BOLUS
500.0000 mL | Freq: Once | INTRAVENOUS | Status: AC
  Administered 2020-11-06: 500 mL via INTRAVENOUS

## 2020-11-06 MED ORDER — OXYTOCIN-SODIUM CHLORIDE 30-0.9 UT/500ML-% IV SOLN: 2.5000 [IU]/h | INTRAVENOUS | Status: DC

## 2020-11-06 MED ORDER — MORPHINE SULFATE (PF) 2 MG/ML IV SOLN: 1.0000 mg | INTRAVENOUS | Status: DC | PRN

## 2020-11-06 MED ORDER — BUPIVACAINE LIPOSOME 1.3 % IJ SUSP
INTRAMUSCULAR | Status: AC
  Filled 2020-11-06: qty 20

## 2020-11-06 MED ORDER — MORPHINE SULFATE (PF) 0.5 MG/ML IJ SOLN
INTRAMUSCULAR | Status: DC | PRN
  Administered 2020-11-06: .1 mg via INTRATHECAL

## 2020-11-06 MED ORDER — CEFAZOLIN IN SODIUM CHLORIDE 3-0.9 GM/100ML-% IV SOLN
3.0000 g | INTRAVENOUS | Status: AC
  Administered 2020-11-06: 3 g via INTRAVENOUS
  Filled 2020-11-06: qty 100

## 2020-11-06 MED ORDER — SCOPOLAMINE 1 MG/3DAYS TD PT72: 1.0000 | MEDICATED_PATCH | Freq: Once | TRANSDERMAL | Status: DC

## 2020-11-06 MED ORDER — ONDANSETRON HCL 4 MG/2ML IJ SOLN
INTRAMUSCULAR | Status: DC | PRN
  Administered 2020-11-06: 4 mg via INTRAVENOUS

## 2020-11-06 MED ORDER — ZOLPIDEM TARTRATE 5 MG PO TABS: 5.0000 mg | ORAL_TABLET | Freq: Every evening | ORAL | Status: DC | PRN

## 2020-11-06 MED ORDER — KETOROLAC TROMETHAMINE 30 MG/ML IJ SOLN: 30.0000 mg | Freq: Four times a day (QID) | INTRAMUSCULAR | Status: AC

## 2020-11-06 MED ORDER — COCONUT OIL OIL: 1.0000 "application " | TOPICAL_OIL | Status: DC | PRN

## 2020-11-06 MED ORDER — NALBUPHINE HCL 10 MG/ML IJ SOLN: 5.0000 mg | Freq: Once | INTRAMUSCULAR | Status: DC | PRN

## 2020-11-06 MED ORDER — IBUPROFEN 600 MG PO TABS
600.0000 mg | ORAL_TABLET | Freq: Four times a day (QID) | ORAL | Status: DC
  Administered 2020-11-07 – 2020-11-08 (×3): 600 mg via ORAL
  Filled 2020-11-06 (×3): qty 1

## 2020-11-06 MED ORDER — LACTATED RINGERS IV SOLN: INTRAVENOUS | Status: DC | PRN

## 2020-11-06 MED ORDER — LACTATED RINGERS IV SOLN: Freq: Once | INTRAVENOUS | Status: AC

## 2020-11-06 MED ORDER — SODIUM CHLORIDE (PF) 0.9 % IJ SOLN
INTRAMUSCULAR | Status: AC
  Filled 2020-11-06: qty 50

## 2020-11-06 MED ORDER — DIPHENHYDRAMINE HCL 25 MG PO CAPS
25.0000 mg | ORAL_CAPSULE | Freq: Four times a day (QID) | ORAL | Status: DC | PRN
Start: 2020-11-06 — End: 2020-11-08
  Administered 2020-11-06: 25 mg via ORAL
  Filled 2020-11-06: qty 1

## 2020-11-06 MED ORDER — OXYCODONE HCL 5 MG PO TABS: 5.0000 mg | ORAL_TABLET | ORAL | Status: DC | PRN

## 2020-11-06 MED ORDER — MORPHINE SULFATE (PF) 0.5 MG/ML IJ SOLN
INTRAMUSCULAR | Status: AC
  Filled 2020-11-06: qty 10

## 2020-11-06 MED ORDER — NALOXONE HCL 4 MG/10ML IJ SOLN
1.0000 ug/kg/h | INTRAVENOUS | Status: DC | PRN
  Filled 2020-11-06: qty 5

## 2020-11-06 MED ORDER — EPHEDRINE SULFATE-NACL 50-0.9 MG/10ML-% IV SOSY: PREFILLED_SYRINGE | INTRAVENOUS | Status: DC | PRN

## 2020-11-06 MED ORDER — TETANUS-DIPHTH-ACELL PERTUSSIS 5-2.5-18.5 LF-MCG/0.5 IM SUSY: 0.5000 mL | PREFILLED_SYRINGE | Freq: Once | INTRAMUSCULAR | Status: DC

## 2020-11-06 MED ORDER — SODIUM CHLORIDE 0.9% FLUSH: 3.0000 mL | INTRAVENOUS | Status: DC | PRN

## 2020-11-06 MED ORDER — KETOROLAC TROMETHAMINE 30 MG/ML IJ SOLN
30.0000 mg | Freq: Four times a day (QID) | INTRAMUSCULAR | Status: AC
  Administered 2020-11-07: 30 mg via INTRAVENOUS
  Filled 2020-11-06: qty 1

## 2020-11-06 MED ORDER — GABAPENTIN 300 MG PO CAPS
300.0000 mg | ORAL_CAPSULE | Freq: Every day | ORAL | Status: DC
  Administered 2020-11-06 – 2020-11-07 (×2): 300 mg via ORAL
  Filled 2020-11-06 (×2): qty 1

## 2020-11-06 MED ORDER — EPHEDRINE SULFATE-NACL 50-0.9 MG/10ML-% IV SOSY
PREFILLED_SYRINGE | INTRAVENOUS | Status: DC | PRN
  Administered 2020-11-06: 10 mg via INTRAVENOUS

## 2020-11-06 MED ORDER — MISOPROSTOL 200 MCG PO TABS
ORAL_TABLET | ORAL | Status: AC
  Filled 2020-11-06: qty 5

## 2020-11-06 MED ORDER — SIMETHICONE 80 MG PO CHEW
80.0000 mg | CHEWABLE_TABLET | Freq: Three times a day (TID) | ORAL | Status: DC
  Administered 2020-11-06 – 2020-11-07 (×2): 80 mg via ORAL
  Filled 2020-11-06 (×3): qty 1

## 2020-11-06 MED ORDER — PRENATAL MULTIVITAMIN CH
1.0000 | ORAL_TABLET | Freq: Every day | ORAL | Status: DC
  Administered 2020-11-07: 1 via ORAL
  Filled 2020-11-06: qty 1

## 2020-11-06 MED ORDER — SODIUM CHLORIDE 0.9 % IV SOLN
INTRAVENOUS | Status: DC | PRN
  Administered 2020-11-06: 50 ug/min via INTRAVENOUS

## 2020-11-06 MED ORDER — METHYLERGONOVINE MALEATE 0.2 MG/ML IJ SOLN
INTRAMUSCULAR | Status: AC
  Filled 2020-11-06: qty 1

## 2020-11-06 MED ORDER — METHYLERGONOVINE MALEATE 0.2 MG/ML IJ SOLN
INTRAMUSCULAR | Status: DC | PRN
  Administered 2020-11-06: .2 mg via INTRAMUSCULAR

## 2020-11-06 MED ORDER — OXYTOCIN-SODIUM CHLORIDE 30-0.9 UT/500ML-% IV SOLN
INTRAVENOUS | Status: AC
  Administered 2020-11-06: 2.5 [IU]/h via INTRAVENOUS
  Filled 2020-11-06: qty 1000

## 2020-11-06 MED ORDER — OXYCODONE HCL 5 MG PO TABS: 10.0000 mg | ORAL_TABLET | ORAL | Status: DC | PRN

## 2020-11-06 MED ORDER — BUPIVACAINE HCL (PF) 0.5 % IJ SOLN
INTRAMUSCULAR | Status: DC | PRN
  Administered 2020-11-06: 30 mL

## 2020-11-06 MED ORDER — IBUPROFEN 600 MG PO TABS: 600.0000 mg | ORAL_TABLET | Freq: Four times a day (QID) | ORAL | Status: DC

## 2020-11-06 MED ORDER — NALOXONE HCL 0.4 MG/ML IJ SOLN: 0.4000 mg | INTRAMUSCULAR | Status: DC | PRN

## 2020-11-06 MED ORDER — BUPIVACAINE LIPOSOME 1.3 % IJ SUSP
INTRAMUSCULAR | Status: DC | PRN
Start: 2020-11-06 — End: 2020-11-06
  Administered 2020-11-06: 20 mL

## 2020-11-06 MED ORDER — FENTANYL CITRATE (PF) 100 MCG/2ML IJ SOLN
INTRAMUSCULAR | Status: AC
  Filled 2020-11-06: qty 2

## 2020-11-06 MED ORDER — OXYTOCIN-SODIUM CHLORIDE 30-0.9 UT/500ML-% IV SOLN
INTRAVENOUS | Status: DC | PRN
  Administered 2020-11-06 (×2): 30 [IU] via INTRAVENOUS

## 2020-11-06 MED ORDER — FENTANYL CITRATE (PF) 100 MCG/2ML IJ SOLN
INTRAMUSCULAR | Status: DC | PRN
  Administered 2020-11-06: 85 ug via INTRAVENOUS

## 2020-11-06 MED ORDER — NALBUPHINE HCL 10 MG/ML IJ SOLN
5.0000 mg | INTRAMUSCULAR | Status: DC | PRN
  Administered 2020-11-06: 5 mg via INTRAVENOUS
  Filled 2020-11-06: qty 1

## 2020-11-06 MED ORDER — SOD CITRATE-CITRIC ACID 500-334 MG/5ML PO SOLN
30.0000 mL | ORAL | Status: AC
  Administered 2020-11-06: 30 mL via ORAL
  Filled 2020-11-06: qty 15

## 2020-11-06 SURGICAL SUPPLY — 37 items
BARRIER ADHS 3X4 INTERCEED (GAUZE/BANDAGES/DRESSINGS) ×3 IMPLANT
BNDG TENSOPLAST 6X5 (GAUZE/BANDAGES/DRESSINGS) ×3 IMPLANT
CHLORAPREP W/TINT 26 (MISCELLANEOUS) ×3 IMPLANT
COVER WAND RF STERILE (DRAPES) ×3 IMPLANT
DRSG TELFA 3X8 NADH (GAUZE/BANDAGES/DRESSINGS) ×3 IMPLANT
ELECT CAUTERY BLADE 6.4 (BLADE) ×3 IMPLANT
ELECT REM PT RETURN 9FT ADLT (ELECTROSURGICAL) ×3
ELECTRODE REM PT RTRN 9FT ADLT (ELECTROSURGICAL) ×1 IMPLANT
GAUZE CURAFIL 4X4 (GAUZE/BANDAGES/DRESSINGS) IMPLANT
GAUZE SPONGE 4X4 12PLY STRL (GAUZE/BANDAGES/DRESSINGS) ×3 IMPLANT
GLOVE SS BIOGEL STRL SZ 6.5 (GLOVE) ×1 IMPLANT
GLOVE SUPERSENSE BIOGEL SZ 6.5 (GLOVE) ×2
GLOVE SURG PROTEXIS BL SZ6.5 (GLOVE) ×3 IMPLANT
GLOVE SURG SYN 8.0 (GLOVE) ×3 IMPLANT
GOWN STRL REUS W/ TWL LRG LVL3 (GOWN DISPOSABLE) ×2 IMPLANT
GOWN STRL REUS W/ TWL XL LVL3 (GOWN DISPOSABLE) ×1 IMPLANT
GOWN STRL REUS W/TWL LRG LVL3 (GOWN DISPOSABLE) ×4
GOWN STRL REUS W/TWL XL LVL3 (GOWN DISPOSABLE) ×2
LAPSAC SURG PACK 8X10 (MISCELLANEOUS) ×6
MANIFOLD NEPTUNE II (INSTRUMENTS) ×3 IMPLANT
MAT PREVALON FULL STRYKER (MISCELLANEOUS) ×3 IMPLANT
NEEDLE HYPO 22GX1.5 SAFETY (NEEDLE) ×3 IMPLANT
NS IRRIG 1000ML POUR BTL (IV SOLUTION) ×3 IMPLANT
PACK C SECTION AR (MISCELLANEOUS) ×3 IMPLANT
PACK SURG LAPSAC 8X10 (MISCELLANEOUS) ×2 IMPLANT
PAD OB MATERNITY 4.3X12.25 (PERSONAL CARE ITEMS) ×3 IMPLANT
PAD PREP 24X41 OB/GYN DISP (PERSONAL CARE ITEMS) ×3 IMPLANT
RETRACTOR TRAXI PANNICULUS (MISCELLANEOUS) ×1 IMPLANT
STRAP SAFETY 5IN WIDE (MISCELLANEOUS) ×3 IMPLANT
SUCT VACUUM KIWI BELL (SUCTIONS) ×3 IMPLANT
SUT CHROMIC 1 CTX 36 (SUTURE) ×9 IMPLANT
SUT CHROMIC 2 0 CT 1 (SUTURE) ×3 IMPLANT
SUT PLAIN GUT 0 (SUTURE) ×9 IMPLANT
SUT VIC AB 0 CT1 36 (SUTURE) ×6 IMPLANT
SUT VICRYL 2-0 SH 8X27 (SUTURE) ×3 IMPLANT
SYR 30ML LL (SYRINGE) ×6 IMPLANT
TRAXI PANNICULUS RETRACTOR (MISCELLANEOUS) ×2

## 2020-11-06 NOTE — Brief Op Note (Signed)
11/06/2020  2:03 PM  PATIENT:  Pamela Robertson  30 y.o. female  PRE-OPERATIVE DIAGNOSIS:  elective repeat cesarean , sterilization  POST-OPERATIVE DIAGNOSIS:  elective repeat cesarean , sterilization  PROCEDURE:  Procedure(s): REPEAT CESAREAN SECTION WITH BILATERAL TUBAL LIGATION (Bilateral)  SURGEON:  Surgeon(s) and Role:    * Jhayla Podgorski, Ihor Austin, MD - Primary  PHYSICIAN ASSISTANT: R. Mcvey,   ASSISTANTS: none   ANESTHESIA:   spinal  EBL:  720 mL IOF 1500 cc UO 50 cc  BLOOD ADMINISTERED:none  DRAINS: Urinary Catheter (Foley)   LOCAL MEDICATIONS USED:  MARCAINE    and BUPIVICAINE   SPECIMEN:  Source of Specimen:  portion right and left tubes   DISPOSITION OF SPECIMEN:  PATHOLOGY  COUNTS:  YES  TOURNIQUET:  * No tourniquets in log *  DICTATION: .Other Dictation: Dictation Number verbal  PLAN OF CARE: Admit to inpatient   PATIENT DISPOSITION:  PACU - hemodynamically stable.   Delay start of Pharmacological VTE agent (>24hrs) due to surgical blood loss or risk of bleeding: not applicable

## 2020-11-06 NOTE — Progress Notes (Signed)
Pt arrived for scheduled repeat C/S.
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
## 2020-11-06 NOTE — Anesthesia Procedure Notes (Signed)
Spinal  Patient location during procedure: OR Start time: 11/06/2020 12:15 PM End time: 11/06/2020 12:19 PM Reason for block: surgical anesthesia Staffing Performed: resident/CRNA  Anesthesiologist: Arita Miss, MD Resident/CRNA: Hedda Slade, CRNA Preanesthetic Checklist Completed: patient identified, IV checked, site marked, risks and benefits discussed, surgical consent, monitors and equipment checked, pre-op evaluation and timeout performed Spinal Block Patient position: sitting Prep: ChloraPrep Patient monitoring: heart rate, continuous pulse ox, blood pressure and cardiac monitor Approach: midline Location: L3-4 Injection technique: single-shot Needle Needle type: Whitacre and Introducer  Needle gauge: 24 G Needle length: 9 cm Assessment Sensory level: T4 Events: CSF return Additional Notes Negative paresthesia. Negative blood return. Positive free-flowing CSF. Expiration date of kit checked and confirmed. Patient tolerated procedure well, without complications.

## 2020-11-06 NOTE — Progress Notes (Signed)
Pt is ready for repeat LTCS and BTL elective . All questions answered . LAbs reviewed . Proceed

## 2020-11-06 NOTE — Transfer of Care (Signed)
Immediate Anesthesia Transfer of Care Note  Patient: Pamela Robertson  Procedure(s) Performed: REPEAT CESAREAN SECTION WITH BILATERAL TUBAL LIGATION (Bilateral )  Patient Location: PACU and Mother/Baby  Anesthesia Type:Spinal  Level of Consciousness: awake, alert  and oriented  Airway & Oxygen Therapy: Patient Spontanous Breathing  Post-op Assessment: Report given to RN and Post -op Vital signs reviewed and stable  Post vital signs: Reviewed and stable  Last Vitals:  Vitals Value Taken Time  BP 125/70 11/06/20 1419  Temp    Pulse    Resp 16 11/06/20 1419  SpO2 98 % 11/06/20 1419    Last Pain:  Vitals:   11/06/20 1026  TempSrc: Oral  PainSc: 0-No pain         Complications: No complications documented.

## 2020-11-06 NOTE — Anesthesia Preprocedure Evaluation (Signed)
Anesthesia Evaluation  Patient identified by MRN, date of birth, ID band Patient awake  General Assessment Comment:Tertiary c section  Reviewed: Allergy & Precautions, NPO status , Patient's Chart, lab work & pertinent test results  History of Anesthesia Complications Negative for: history of anesthetic complications  Airway Mallampati: II  TM Distance: >3 FB Neck ROM: Full    Dental no notable dental hx. (+) Teeth Intact   Pulmonary neg pulmonary ROS, neg sleep apnea, neg COPD, Patient abstained from smoking.Not current smoker, former smoker,    Pulmonary exam normal breath sounds clear to auscultation       Cardiovascular Exercise Tolerance: Good METS(-) hypertension(-) CAD and (-) Past MI negative cardio ROS  (-) dysrhythmias  Rhythm:Regular Rate:Normal - Systolic murmurs    Neuro/Psych PSYCHIATRIC DISORDERS Anxiety Depression Bipolar Disorder negative neurological ROS     GI/Hepatic neg GERD  ,(+)     (-) substance abuse  ,   Endo/Other  neg diabetesMorbid obesity  Renal/GU negative Renal ROS     Musculoskeletal   Abdominal   Peds  Hematology  (+) anemia ,   Anesthesia Other Findings Past Medical History: No date: Anemia     Comment:  h/o No date: Bipolar 1 disorder, depressed, full remission (HCC)     Comment:  controlled with lamictal and prozac 2010: Depression     Comment:  [facility]ical ideation at [facility]  No date: Menstrual irregularity     Comment:  occurring every 2 or 3 months  Reproductive/Obstetrics (+) Pregnancy                             Anesthesia Physical Anesthesia Plan  ASA: III  Anesthesia Plan: Spinal   Post-op Pain Management:    Induction:   PONV Risk Score and Plan: 4 or greater and Ondansetron and Dexamethasone  Airway Management Planned: Natural Airway  Additional Equipment:   Intra-op Plan:   Post-operative Plan:   Informed  Consent: I have reviewed the patients History and Physical, chart, labs and discussed the procedure including the risks, benefits and alternatives for the proposed anesthesia with the patient or authorized representative who has indicated his/her understanding and acceptance.       Plan Discussed with: CRNA and Surgeon  Anesthesia Plan Comments: (Discussed R/B/A of neuraxial anesthesia technique with patient: - rare risks of spinal/epidural hematoma, nerve damage, infection - Risk of PDPH - Risk of itching - Risk of nausea and vomiting - Risk of conversion to general anesthesia and its associated risks, including sore throat, damage to lips/teeth/oropharynx, and rare risks such as cardiac and respiratory events. - Risk of surgical bleeding requiring blood products Patient voiced understanding.)        Anesthesia Quick Evaluation

## 2020-11-06 NOTE — Op Note (Signed)
NAMECHANDLAR, STAEBELL MEDICAL RECORD NO: [id number] ACCOUNT NO: [id number] DATE OF BIRTH: 12-06-1990 FACILITY: [facility] LOCATION: [facility] PHYSICIAN: Suzy Bouchard, MD  Operative Report   DATE OF PROCEDURE: 11/06/2020  PREOPERATIVE DIAGNOSES: 1.  39+0 weeks' estimated gestational age. 2.  Elective repeat cesarean section. 3.  Elective sterilization.  POSTOPERATIVE DIAGNOSES: 1.  39+0 weeks' estimated gestational age. 2.  Elective repeat cesarean section. 3.  Elective sterilization. 4.  Vigorous female delivered. 5.  Significant abdominal and pelvic adhesions.  PROCEDURE: 1.  Repeat low transverse cesarean section. 2.  Abdominal and pelvic adhesiolysis. 3.  Bilateral tubal ligation, [place].  SURGEON:  Suzy Bouchard, MD  FIRST ASSISTANT:  Heloise Ochoa, certified nurse midwife.  SECOND ASSISTANT:  Chari Manning, certified nurse midwife.  ANESTHESIA:  Spinal.  INDICATION:  A 30 year old gravida 3, para 2 patient with 2 prior cesarean sections, has elected for repeat cesarean section and permanent sterilization.  DESCRIPTION OF PROCEDURE:  After adequate spinal anesthesia, the patient was placed in dorsal supine position with hip roll under the right side.  The patient's abdomen was prepped and draped in sterile fashion.  Timeout was performed.  The patient  received 3 grams IV Ancef prior to commencement of the case for surgical prophylaxis.  A Pfannenstiel incision was made two fingerbreadths above the symphysis pubis.  Sharp dissection was used to identify the fascia.  Fascia was opened in the midline and  opened in a transverse fashion.  There were dense adhesions of the fascial edges that were encountered.  The superior aspect of the fascia was grasped and dense adhesions were encountered and ultimately the recti muscles were dissected free.  The  inferior aspect of the fascia was grasped with Kocher clamps and again dense adhesions were encountered and  took several minutes to ultimately dissect free the adhesions from the pyramidalis and inferior aspect of the rectus muscle.  Dense omental  adhesions were encountered in entering the peritoneal cavity sharp dissection and blunt dissection was utilized.  Upon entry into the abdominal cavity, there were multiple adhesions from the abdominal wall to the uterus.  These were taken down with the  Bovie and blunt dissection.  Ultimately, the lower uterine segment was visualized and a bladder blade was placed into the lower uterus.  Given the prior adhesions and limited mobility a bladder window was not created and a direct low transverse uterine  incision was made.  Upon entry into the endometrial cavity, clear fluid resulted.  Fetal head was brought to the incision and again limited space and required placement of a Kiwi vacuum onto the fetal occiput.  One popoff was encountered.  The uterus was  then opened cephalad with banjo scissors and a repeat application of the Kiwi was applied to the occiput and with significant effort the head was ultimately delivered.  The shoulders were delivered followed by the rest of the body.  Vigorous female was  then placed on the abdomen and dried and suctioned for 60 seconds.  Cord was doubly clamped and the infant was passed to pediatrician who assigned Apgar scores of 8 and 9.  Time of birth 13:03.  Vigorous female, weight 3160 grams.  The uterus was  exteriorized with significant effort given the size of the uterus and other adhesions to the fundus.  Ultimately, the uterus was delivered externally and the endometrial cavity was wiped clean with laparotomy tape.  Uterine incision was closed with #1  chromic suture in a running locking fashion.  Several  additional figure-of-eight sutures were required for hemostasis.  Attention was directed to the patient's right fallopian tube, which was grasped at the mid portion of the fallopian tube in 2 separate  0 plain gut sutures were  placed and a 1.5 cm portion of fallopian tube was removed.  Similar procedure was repeated on the patient's left fallopian tube again at the mid portion of the fallopian tube, two separate 0 plain gut sutures were placed and a  1.5 cm portion of fallopian tube was removed.  The posterior cul-de-sac was irrigated and suctioned again with significant effort given the lack of mobility of the maternal tissues and the size of the uterus, the uterus was ultimately squeezed back into  the abdominal cavity.  The pericolic gutters were wiped clean with laparotomy tape and the tubal ligation sites were visualized and appeared hemostatic.  The fascia was elevated and subfascial tissues were cauterized for hemostasis.  Figure-of-eight  sutures were required for hemostasis in 2 separate areas.  Then, the fascia was reapproximated with 0 Vicryl suture, two separate sutures were used with good approximation of tissues.  Subfascial tissues were injected with a solution of 20 mL of 1.3%  Exparel plus 30 mL of 0.5% Marcaine plus 50 mL normal saline, approximately 50 mL of the solution was injected beneath the fascia.  Subcutaneous tissue was irrigated and Bovie for hemostasis and given the depth of the subcutaneous tissues 5 cm the  subcutaneous dead space was closed with a running 2-0 chromic suture with good approximation of the edges and the skin was then reapproximated with Insorb absorbable staples and additional 30 mL of Exparel solution was injected beneath the skin.  There  were no complications.  QUANTITATIVE BLOOD LOSS:  720 mL.  INTRAOPERATIVE FLUIDS:  1500 mL  URINE OUTPUT:  50 mL   PUS D: 11/06/2020 2:54:09 pm T: 11/06/2020 8:16:00 pm  JOB: [id number]/ [id number]

## 2020-11-07 LAB — CBC
HCT: 26 % — ABNORMAL LOW (ref 36.0–46.0)
Hemoglobin: 8.3 g/dL — ABNORMAL LOW (ref 12.0–15.0)
MCH: 25.4 pg — ABNORMAL LOW (ref 26.0–34.0)
MCHC: 31.9 g/dL (ref 30.0–36.0)
MCV: 79.5 fL — ABNORMAL LOW (ref 80.0–100.0)
Platelets: 207 10*3/uL (ref 150–400)
RBC: 3.27 MIL/uL — ABNORMAL LOW (ref 3.87–5.11)
RDW: 15.2 % (ref 11.5–15.5)
WBC: 11.5 10*3/uL — ABNORMAL HIGH (ref 4.0–10.5)
nRBC: 0 % (ref 0.0–0.2)

## 2020-11-07 MED ORDER — LACTATED RINGERS IV SOLN: INTRAVENOUS | Status: DC

## 2020-11-07 NOTE — Progress Notes (Signed)
Postop Day  1  Subjective: voiding, tolerating PO and + flatus  Doing well, no concerns. Ambulating without difficulty, pain managed with PO meds, tolerating regular diet, and voiding without difficulty.   No fever/chills, chest pain, shortness of breath, nausea/vomiting, or leg pain. No nipple or breast pain. No headache, visual changes, or RUQ/epigastric pain.  Objective: BP 110/79 (BP Location: Right Arm)   Pulse 82   Temp 97.9 F (36.6 C) (Oral)   Resp 20   Ht 5\' 1"  (1.549 m)   Wt 127.9 kg   SpO2 98%   Breastfeeding Unknown   BMI 53.28 kg/m    Physical Exam:  General: alert, cooperative and no distress Breasts: soft/nontender CV: RRR Pulm: nl effort, CTABL Abdomen: soft, non-tender, active bowel sounds Uterine Fundus: firm Incision: no significant drainage, occlusive dressing C/D/I Perineum: minimal edema, intact Lochia: appropriate DVT Evaluation: No evidence of DVT seen on physical exam.  Recent Labs    11/07/20 0628  HGB 8.3*  HCT 26.0*  WBC 11.5*  PLT 207    Assessment/Plan: 30 y.o. G3P3003 postpartum day # 1  -Continue routine postpartum care -Lactation consult PRN for breastfeeding  -Acute blood loss anemia - hemodynamically stable and asymptomatic; start PO ferrous sulfate BID with stool softeners  -Immunization status:   all immunizations up to date  -Plan to remove current dressing when she showers tomorrow and replace with OP Site occlusive honeycomb dressing    Disposition: Continue inpatient postpartum care    LOS: 1 day   34, CNM 11/07/2020, 12:39 PM   ----- 01/07/2021  Certified Nurse Midwife [facility]

## 2020-11-07 NOTE — Anesthesia Postprocedure Evaluation (Signed)
Anesthesia Post Note  Patient: Pamela Robertson  Procedure(s) Performed: REPEAT CESAREAN SECTION WITH BILATERAL TUBAL LIGATION (Bilateral )  Patient location during evaluation: Mother Baby Anesthesia Type: Spinal Level of consciousness: awake and alert Pain management: pain level controlled Vital Signs Assessment: post-procedure vital signs reviewed and stable Respiratory status: spontaneous breathing, nonlabored ventilation and respiratory function stable Cardiovascular status: stable Postop Assessment: no headache and no backache Anesthetic complications: no   No complications documented.   Last Vitals:  Vitals:   11/07/20 0500 11/07/20 0710  BP:  110/79  Pulse:  82  Resp:  20  Temp:  36.6 C  SpO2: 99% 98%    Last Pain:  Vitals:   11/07/20 0715  TempSrc:   PainSc: 1                  Lynden Oxford

## 2020-11-07 NOTE — Anesthesia Post-op Follow-up Note (Signed)
  Anesthesia Pain Follow-up Note  Patient: Pamela Robertson  Day #: 1  Date of Follow-up: 11/07/2020 Time: 7:41 AM  Last Vitals:  Vitals:   11/07/20 0500 11/07/20 0710  BP:  110/79  Pulse:  82  Resp:  20  Temp:  36.6 C  SpO2: 99% 98%    Level of Consciousness: alert  Pain: none   Side Effects:Pruritis  Catheter Site Exam:clean, dry  Anti-Coag Meds (From admission, onward)   Start     Dose/Rate Route Frequency Ordered Stop   11/07/20 1300  enoxaparin (LOVENOX) injection 40 mg        40 mg Subcutaneous Every 24 hours 11/06/20 1702         Plan: D/C from anesthesia care at surgeon's request  Lynden Oxford

## 2020-11-08 LAB — SURGICAL PATHOLOGY

## 2020-11-08 MED ORDER — FERROUS SULFATE 325 (65 FE) MG PO TABS: 325.0000 mg | ORAL_TABLET | Freq: Two times a day (BID) | ORAL | Status: DC

## 2020-11-08 MED ORDER — OXYCODONE HCL 5 MG PO TABS: 5.0000 mg | ORAL_TABLET | ORAL | 0 refills | Status: AC | PRN

## 2020-11-08 MED ORDER — FERROUS SULFATE 325 (65 FE) MG PO TABS: 325.0000 mg | ORAL_TABLET | Freq: Two times a day (BID) | ORAL | 3 refills | Status: AC

## 2020-11-08 NOTE — Discharge Instructions (Signed)
  Postpartum Care After Cesarean Delivery This sheet gives you information about how to care for yourself from the time you deliver your baby to up to 6-12 weeks after delivery (postpartum period). Your health care provider may also give you more specific instructions. If you have problems or questions, contact your health care provider. Follow these instructions at home: Medicines  Take over-the-counter and prescription medicines only as told by your health care provider.  If you were prescribed an antibiotic medicine, take it as told by your health care provider. Do not stop taking the antibiotic even if you start to feel better.  Ask your health care provider if the medicine prescribed to you: ? Requires you to avoid driving or using heavy machinery. ? Can cause constipation. You may need to take actions to prevent or treat constipation, such as:  Drink enough fluid to keep your urine pale yellow.  Take over-the-counter or prescription medicines.  Eat foods that are high in fiber, such as beans, whole grains, and fresh fruits and vegetables.  Limit foods that are high in fat and processed sugars, such as fried or sweet foods. Activity  Gradually return to your normal activities as told by your health care provider.  Avoid activities that take a lot of effort and energy (are strenuous) until approved by your health care provider. Walking at a slow to moderate pace is usually safe. Ask your health care provider what activities are safe for you. ? Do not lift anything that is heavier than your baby or 10 lb (4.5 kg) as told by your health care provider. ? Do not vacuum, climb stairs, or drive a car for as long as told by your health care provider.  If possible, have someone help you at home until you are able to do your usual activities yourself.  Rest as much as possible. Try to rest or take naps while your baby is sleeping. Vaginal bleeding  It is normal to have vaginal  bleeding (lochia) after delivery. Wear a sanitary pad to absorb vaginal bleeding and discharge. ? During the first week after delivery, the amount and appearance of lochia is often similar to a menstrual period. ? Over the next few weeks, it will gradually decrease to a dry, yellow-brown discharge. ? For most women, lochia stops completely by 4-6 weeks after delivery. Vaginal bleeding can vary from woman to woman.  Change your sanitary pads frequently. Watch for any changes in your flow, such as: ? A sudden increase in volume. ? A change in color. ? Large blood clots.  If you pass a blood clot, save it and call your health care provider to discuss. Do not flush blood clots down the toilet before you get instructions from your health care provider.  Do not use tampons or douches until your health care provider says this is safe.  If you are not breastfeeding, your period should return 6-8 weeks after delivery. If you are breastfeeding, your period may return anytime between 8 weeks after delivery and the time that you stop breastfeeding. Perineal care  If your C-section (Cesarean section) was unplanned, and you were allowed to labor and push before delivery, you may have pain, swelling, and discomfort of the tissue between your vaginal opening and your anus (perineum). You may also have an incision in the tissue (episiotomy) or the tissue may have torn during delivery. Follow these instructions as told by your health care provider: ? Keep your perineum clean and dry as told   by your health care provider. Use medicated pads and pain-relieving sprays and creams as directed. ? If you have an episiotomy or vaginal tear, check the area every day for signs of infection. Check for:  Redness, swelling, or pain.  Fluid or blood.  Warmth.  Pus or a bad smell. ? You may be given a squirt bottle to use instead of wiping to clean the perineum area after you go to the bathroom. As you start healing, you  may use the squirt bottle before wiping yourself. Make sure to wipe gently. ? To relieve pain caused by an episiotomy, vaginal tear, or hemorrhoids, try taking a warm sitz bath 2-3 times a day. A sitz bath is a warm water bath that is taken while you are sitting down. The water should only come up to your hips and should cover your buttocks.   Breast care  Within the first few days after delivery, your breasts may feel heavy, full, and uncomfortable (breast engorgement). You may also have milk leaking from your breasts. Your health care provider can suggest ways to help relieve breast discomfort. Breast engorgement should go away within a few days.  If you are breastfeeding: ? Wear a bra that supports your breasts and fits you well. ? Keep your nipples clean and dry. Apply creams and ointments as told by your health care provider. ? You may need to use breast pads to absorb milk leakage. ? You may have uterine contractions every time you breastfeed for several weeks after delivery. Uterine contractions help your uterus return to its normal size. ? If you have any problems with breastfeeding, work with your health care provider or a lactation consultant.  If you are not breastfeeding: ? Avoid touching your breasts as this can make your breasts produce more milk. ? Wear a well-fitting bra and use cold packs to help with swelling. ? Do not squeeze out (express) milk. This causes you to make more milk. Intimacy and sexuality  Ask your health care provider when you can engage in sexual activity. This may depend on your: ? Risk of infection. ? Healing rate. ? Comfort and desire to engage in sexual activity.  You are able to get pregnant after delivery, even if you have not had your period. If desired, talk with your health care provider about methods of family planning or birth control (contraception). Lifestyle  Do not use any products that contain nicotine or tobacco, such as cigarettes,  e-cigarettes, and chewing tobacco. If you need help quitting, ask your health care provider.  Do not drink alcohol, especially if you are breastfeeding. Eating and drinking  Drink enough fluid to keep your urine pale yellow.  Eat high-fiber foods every day. These may help prevent or relieve constipation. High-fiber foods include: ? Whole grain cereals and breads. ? Brown rice. ? Beans. ? Fresh fruits and vegetables.  Take your prenatal vitamins until your postpartum checkup or until your health care provider tells you it is okay to stop.   General instructions  Keep all follow-up visits for you and your baby as told by your health care provider. Most women visit their health care provider for a postpartum checkup within the first 3-6 weeks after delivery. Contact a health care provider if you:  Feel unable to cope with the changes that a new baby brings to your life, and these feelings do not go away.  Feel unusually sad or worried.  Have breasts that are painful, hard, or turn red.    Have a fever.  Have trouble holding urine or keeping urine from leaking.  Have little or no interest in activities you used to enjoy.  Have not breastfed at all and you have not had a menstrual period for 12 weeks after delivery.  Have stopped breastfeeding and you have not had a menstrual period for 12 weeks after you stopped breastfeeding.  Have questions about caring for yourself or your baby.  Pass a blood clot from your vagina. Get help right away if you:  Have chest pain.  Have difficulty breathing.  Have sudden, severe leg pain.  Have severe pain or cramping in your abdomen.  Bleed from your vagina so much that you fill more than one sanitary pad in one hour. Bleeding should not be heavier than your heaviest period.  Develop a severe headache.  Faint.  Have blurred vision or spots in your vision.  Have a bad-smelling vaginal discharge.  Have thoughts about hurting yourself  or your baby. If you ever feel like you may hurt yourself or others, or have thoughts about taking your own life, get help right away. You can go to your nearest emergency department or call:  Your local emergency services (911 in the [place]  A suicide crisis helpline, such as the National Suicide Prevention Lifeline at 1-800-273-8255. This is open 24 hours a day. Summary  The period of time from when you deliver your baby to up to 6-12 weeks after delivery is called the postpartum period.  Gradually return to your normal activities as told by your health care provider.  Keep all follow-up visits for you and your baby as told by your health care provider. This information is not intended to replace advice given to you by your health care provider. Make sure you discuss any questions you have with your health care provider. Document Revised: 01/07/2018 Document Reviewed: 01/07/2018 Elsevier Patient Education  2021 Elsevier Inc.   Postpartum Baby Blues The postpartum period begins right after the birth of a baby. During this time, there is often joy and excitement. It is also a time of many changes in the life of the parents. A mother may feel happy one minute and sad or stressed the next. These feelings of sadness, called the baby blues, usually happen in the period right after the baby is born and go away within a week or two. What are the causes? The exact cause of this condition is not known. Changes in hormone levels after childbirth are believed to trigger some of the symptoms. Other factors that can play a role in these mood changes include:  Lack of sleep.  Stressful life events, such as financial problems, caring for a loved one, or death of a loved one.  Genetics. What are the signs or symptoms? Symptoms of this condition include:  Changes in mood, such as going from extreme happiness to sadness.  A decrease in concentration.  Difficulty sleeping.  Crying spells and  tearfulness.  Loss of appetite.  Irritability.  Anxiety. If these symptoms last for more than 2 weeks or become more severe, you may have postpartum depression. How is this diagnosed? This condition is diagnosed based on an evaluation of your symptoms. Your health care provider may use a screening tool that includes a list of questions to help identify a person with the baby blues or postpartum depression. How is this treated? The baby blues usually go away on their own in 1-2 weeks. Social support is often what is needed.   You will be encouraged to get adequate sleep and rest. Follow these instructions at home: Lifestyle  Get as much rest as you can. Take a nap when the baby sleeps.  Exercise regularly as told by your health care provider. Some women find yoga and walking to be helpful.  Eat a balanced and nourishing diet. This includes plenty of fruits and vegetables, whole grains, and lean proteins.  Do little things that you enjoy. Take a bubble bath, read your favorite magazine, or listen to your favorite music.  Avoid alcohol.  Ask for help with household chores, cooking, grocery shopping, or running errands. Do not try to do everything yourself. Consider hiring a postpartum doula to help. This is a professional who specializes in providing support to new mothers.  Try not to make any major life changes during pregnancy or right after giving birth. This can add stress.      General instructions  Talk to people close to you about how you are feeling. Get support from your partner, family members, friends, or other new moms. You may want to join a support group.  Find ways to manage stress. This may include: ? Writing your thoughts and feelings in a journal. ? Spending time outside. ? Spending time with people who make you laugh.  Try to stay positive in how you think. Think about the things you are grateful for.  Take over-the-counter and prescription medicines only as  told by your health care provider.  Let your health care provider know if you have any concerns.  Keep all postpartum visits. This is important. Contact a health care provider if:  Your baby blues do not go away after 2 weeks. Get help right away if:  You have thoughts of taking your own life (suicidal thoughts), or of harming your baby or someone else.  You see or hear things that are not there (hallucinations). If you ever feel like you may hurt yourself or others, or have thoughts about taking your own life, get help right away. Go to your nearest emergency department or:  Call your local emergency services (911 in the [place]  Call a suicide crisis helpline, such as the National Suicide Prevention Lifeline, at 1-800-273-8255. This is open 24 hours a day in the [place]  Text the Crisis Text Line at 741741 (in the [place] Summary  After giving birth, you may feel happy one minute and sad or stressed the next. Feelings of sadness that happen right after the baby is born and go away after a week or two are called the baby blues.  You can manage the baby blues by getting enough rest, eating a healthy diet, exercising, spending time with supportive people, and finding ways to manage stress.  If feelings of sadness and stress last longer than 2 weeks or get in the way of caring for your baby, talk with your health care provider. This may mean you have postpartum depression. This information is not intended to replace advice given to you by your health care provider. Make sure you discuss any questions you have with your health care provider. Document Revised: 11/12/2019 Document Reviewed: 11/12/2019 Elsevier Patient Education  2021 Elsevier Inc.  

## 2020-11-08 NOTE — Progress Notes (Signed)
Pt discharged with infant. Discharge instructions, prescriptions, and follow up appointments given to and reviewed with patient. Pt verbalized understanding. Escorted out by auxillary.  

## 2020-11-09 ENCOUNTER — Encounter: Payer: Self-pay | Admitting: Obstetrics and Gynecology

## 2024-03-30 ENCOUNTER — Inpatient Hospital Stay

## 2024-03-30 ENCOUNTER — Encounter: Payer: Self-pay | Admitting: Oncology

## 2024-03-30 ENCOUNTER — Inpatient Hospital Stay: Attending: Oncology | Admitting: Oncology

## 2024-03-30 VITALS — BP 114/79 | HR 87 | Temp 98.6°F | Resp 20 | Wt 290.7 lb

## 2024-03-30 DIAGNOSIS — R771 Abnormality of globulin: Secondary | ICD-10-CM | POA: Diagnosis not present

## 2024-03-30 DIAGNOSIS — R778 Other specified abnormalities of plasma proteins: Secondary | ICD-10-CM

## 2024-03-30 DIAGNOSIS — Z79899 Other long term (current) drug therapy: Secondary | ICD-10-CM | POA: Diagnosis not present

## 2024-03-30 DIAGNOSIS — F17210 Nicotine dependence, cigarettes, uncomplicated: Secondary | ICD-10-CM | POA: Diagnosis not present

## 2024-03-30 DIAGNOSIS — F3176 Bipolar disorder, in full remission, most recent episode depressed: Secondary | ICD-10-CM | POA: Insufficient documentation

## 2024-03-30 DIAGNOSIS — D801 Nonfamilial hypogammaglobulinemia: Secondary | ICD-10-CM

## 2024-03-30 LAB — COMPREHENSIVE METABOLIC PANEL WITH GFR
ALT: 24 U/L (ref 0–44)
AST: 23 U/L (ref 15–41)
Albumin: 4.5 g/dL (ref 3.5–5.0)
Alkaline Phosphatase: 83 U/L (ref 38–126)
Anion gap: 9 (ref 5–15)
BUN: 16 mg/dL (ref 6–20)
CO2: 24 mmol/L (ref 22–32)
Calcium: 9.2 mg/dL (ref 8.9–10.3)
Chloride: 102 mmol/L (ref 98–111)
Creatinine, Ser: 0.96 mg/dL (ref 0.44–1.00)
GFR, Estimated: >60 mL/min (ref >60)
Glucose, Bld: 90 mg/dL (ref 70–99)
Potassium: 3.6 mmol/L (ref 3.5–5.1)
Sodium: 135 mmol/L (ref 135–145)
Total Bilirubin: 0.6 mg/dL (ref 0.0–1.2)
Total Protein: 9.3 g/dL — ABNORMAL HIGH (ref 6.5–8.1)

## 2024-03-30 LAB — CBC WITH DIFFERENTIAL/PLATELET
Abs Immature Granulocytes: 0.03 K/uL (ref 0.00–0.07)
Basophils Absolute: 0.1 K/uL (ref 0.0–0.1)
Basophils Relative: 1 %
Eosinophils Absolute: 0.4 K/uL (ref 0.0–0.5)
Eosinophils Relative: 4 %
HCT: 43 % (ref 36.0–46.0)
Hemoglobin: 13.8 g/dL (ref 12.0–15.0)
Immature Granulocytes: 0 %
Lymphocytes Relative: 23 %
Lymphs Abs: 2.2 K/uL (ref 0.7–4.0)
MCH: 27.3 pg (ref 26.0–34.0)
MCHC: 32.1 g/dL (ref 30.0–36.0)
MCV: 85 fL (ref 80.0–100.0)
Monocytes Absolute: 0.6 K/uL (ref 0.1–1.0)
Monocytes Relative: 6 %
Neutro Abs: 6.5 K/uL (ref 1.7–7.7)
Neutrophils Relative %: 66 %
Platelets: 459 K/uL — ABNORMAL HIGH (ref 150–400)
RBC: 5.06 MIL/uL (ref 3.87–5.11)
RDW: 15.2 % (ref 11.5–15.5)
WBC: 9.8 K/uL (ref 4.0–10.5)
nRBC: 0 % (ref 0.0–0.2)

## 2024-03-30 LAB — HEPATITIS PANEL, ACUTE
HCV Ab: NONREACTIVE
Hep A IgM: NONREACTIVE
Hep B C IgM: NONREACTIVE
Hepatitis B Surface Ag: NONREACTIVE

## 2024-03-30 LAB — HIV ANTIBODY (ROUTINE TESTING W REFLEX): HIV Screen 4th Generation wRfx: NONREACTIVE

## 2024-03-30 NOTE — Assessment & Plan Note (Signed)
 SPEP showed no M protein. Increase gamma globulin.  Elevated total protein with high gamma globulin suggests chronic inflammation, infection, liver disease.   Check cbc cmp myeloma panel, light chain ratio

## 2024-03-30 NOTE — Progress Notes (Addendum)
 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 Increase gamma globulin.  Elevated total protein with high gamma globulin suggests chronic inflammation, infection, liver disease.   Check cbc cmp myeloma panel, light chain ratio   Orders Placed This Encounter  Procedures   HIV Antibody (routine testing w rflx)    Standing Status:   Future    Number of Occurrences:   1    Expected Date:   03/30/2024    Expiration Date:   06/28/2024   Hepatitis panel, acute    Standing Status:   Future    Number of Occurrences:   1    Expected Date:   03/30/2024    Expiration Date:   06/28/2024   Multiple Myeloma Panel (SPEP&IFE w/QIG)    Standing Status:   Future    Number of Occurrences:   1    Expected Date:   03/30/2024    Expiration Date:   06/28/2024   Kappa/lambda light chains    Standing Status:   Future    Number of Occurrences:   1    Expected Date:   03/30/2024    Expiration Date:   06/28/2024   CBC with Differential/Platelet    Standing Status:   Future    Number of Occurrences:   1    Expected Date:   03/30/2024    Expiration Date:   06/28/2024   Comprehensive metabolic panel with GFR    Standing Status:   Future    Number of Occurrences:   1    Expected Date:   03/30/2024    Expiration Date:   06/28/2024   Follow up in 3-4 weeks.  All questions were answered. The patient knows to call the clinic with any problems, questions or concerns.  Zelphia Cap, MD, PhD [facility] Hematology Oncology 03/30/2024   HISTORY OF PRESENTING ILLNESS:   Pamela Robertson is a  33 y.o.  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   She was referred by her primary care doctor for evaluation of elevated protein levels found on a protein electrophoresis test conducted on September 3rd.  She generally feels well at baseline and has no known autoimmune diseases. No night sweats, low-grade fevers, or chronic joint pain. Occasionally experiences aching in her shoulders and arms, but no joint swelling, stiffness, or difficulty forming a fist. No chronic sinus issues, skin rashes, or chronic wounds that are not healing.  She experiences restlessness at night, leading to poor sleep quality. By the time she falls asleep, it is often time to wake up to prepare her children for school and their therapies.  She has a history of irregular menstrual periods, with the longest duration of amenorrhea occurring after her last child, who is now [age] years old. She has not had a period since then.    MEDICAL HISTORY:  Past Medical History:  Diagnosis Date   Anemia    h/o   Bipolar 1 disorder, depressed, full remission    controlled with lamictal  and prozac    Depression 2010   hosp for suical ideation  at [facility]    Menstrual irregularity    occurring every 2 or 3 months    SURGICAL HISTORY: Past Surgical History:  Procedure Laterality Date   CESAREAN SECTION  04/28/2016   Procedure: CESAREAN SECTION;  Surgeon: Debby JINNY Dinsmore, MD;  Location: [facility] ORS;  Service: Obstetrics;;   CESAREAN SECTION N/A 04/28/2018   Procedure: CESAREAN SECTION, repeat;  Surgeon: Dinsmore Debby JINNY, MD;  Location: [facility] ORS;  Service: Obstetrics;  Laterality: N/A;  TOB 1125 APGAR 8/9 7lb 5oz   CESAREAN SECTION WITH BILATERAL TUBAL LIGATION Bilateral 11/06/2020   Procedure: REPEAT CESAREAN SECTION WITH BILATERAL TUBAL LIGATION;  Surgeon: Dinsmore Debby JINNY, MD;  Location: [facility] ORS;  Service: Obstetrics;  Laterality: Bilateral;   EXCISION OF KELOID  04/28/2018    Procedure: Removal of cicatrix;  Surgeon: Schermerhorn, Debby JINNY, MD;  Location: [facility] ORS;  Service: Obstetrics;;    SOCIAL HISTORY: Social History   Socioeconomic History   Marital status: Single    Spouse name: DJ   Number of children: Not on file   Years of education: Not on file   Highest education level: Not on file  Occupational History   Not on file  Tobacco Use   Smoking status: Some Days    Current packs/day: 0.00    Types: Cigarettes    Last attempt to quit: 05/04/2011    Years since quitting: 12.9   Smokeless tobacco: Never   Tobacco comments:    not daily,  less than pack/month  Vaping Use   Vaping status: Never Used  Substance and Sexual Activity   Alcohol use: No    Comment: last use 06/30/15   Drug use: No   Sexual activity: Not Currently    Partners: Male    Birth control/protection: Surgical  Other Topics Concern   Not on file  Social History Narrative   Not on file   Social Drivers of Health   Financial Resource Strain: Low Risk  (02/04/2024)   Received from [facility]   Overall Financial Resource Strain (CARDIA)    Difficulty of Paying Living Expenses: Not very hard  Food Insecurity: No Food Insecurity (03/30/2024)   Hunger Vital Sign    Worried About Running Out of Food in the Last Year: Never true    Ran Out of Food in the Last Year: Never true  Recent Concern: Food Insecurity - Food Insecurity Present (02/04/2024)   Received from [facility]   Hunger Vital Sign    Within the past 12 months, you worried that your food would run out before you got the money to buy more.: Sometimes true    Within the past 12 months, the food you bought just didn't last and you didn't have money to get more.: Sometimes true  Transportation Needs: No Transportation Needs (03/30/2024)   PRAPARE - Administrator, Civil Service (Medical): No    Lack of Transportation (Non-Medical): No  Physical Activity: Not on file   Stress: Not on file  Social Connections: Not on file  Intimate Partner Violence: Not At Risk (03/30/2024)   Humiliation, Afraid, Rape, and Kick questionnaire    Fear of Current or Ex-Partner: No    Emotionally Abused: No    Physically Abused: No    Sexually Abused: No    FAMILY HISTORY: Family History  Problem Relation Age of Onset   Diabetes Father     ALLERGIES:  has no known allergies.  MEDICATIONS:  Current Outpatient  Medications  Medication Sig Dispense Refill   escitalopram (LEXAPRO) 10 MG tablet Take 10 mg by mouth daily.     ibuprofen  (ADVIL ) 800 MG tablet Take 800 mg by mouth.     phentermine  30 MG capsule Take 30 mg by mouth.     topiramate (TOPAMAX) 25 MG tablet Take 25 mg by mouth at bedtime.     Vitamin D, Ergocalciferol, (DRISDOL) 1.25 MG (50000 UNIT) CAPS capsule Take 50,000 Units by mouth once a week.     ferrous sulfate  325 (65 FE) MG tablet Take 1 tablet (325 mg total) by mouth 2 (two) times daily with a meal. (Patient not taking: Reported on 03/30/2024) 60 tablet 3   oxyCODONE  (OXY IR/ROXICODONE ) 5 MG immediate release tablet Take 1 tablet (5 mg total) by mouth every 4 (four) hours as needed for moderate pain. (Patient not taking: Reported on 03/30/2024) 30 tablet 0   Prenat-FeAsp-Meth-FA-DHA w/o A (PRENATE PIXIE) 10-0.6-0.4-200 MG CAPS Take 1 capsule by mouth daily. (Patient not taking: Reported on 03/30/2024)     No current facility-administered medications for this visit.    Review of Systems  Constitutional:  Negative for appetite change, chills, fatigue and fever.  HENT:   Negative for hearing loss and voice change.   Eyes:  Negative for eye problems.  Respiratory:  Negative for chest tightness and cough.   Cardiovascular:  Negative for chest pain.  Gastrointestinal:  Negative for abdominal distention, abdominal pain and blood in stool.  Endocrine: Negative for hot flashes.  Genitourinary:  Negative for difficulty urinating and frequency.    Musculoskeletal:  Negative for arthralgias.  Skin:  Negative for itching and rash.  Neurological:  Negative for extremity weakness.  Hematological:  Negative for adenopathy.  Psychiatric/Behavioral:  Negative for confusion.    PHYSICAL EXAMINATION:  Vitals:   03/30/24 1513  BP: 114/79  Pulse: 87  Resp: 20  Temp: 98.6 F (37 C)  SpO2: 100%   Filed Weights   03/30/24 1513  Weight: 290 lb 11.2 oz (131.9 kg)    Physical Exam Constitutional:      General: She is not in acute distress. HENT:     Head: Normocephalic and atraumatic.  Eyes:     General: No scleral icterus. Cardiovascular:     Rate and Rhythm: Normal rate and regular rhythm.  Pulmonary:     Effort: Pulmonary effort is normal. No respiratory distress.     Breath sounds: Normal breath sounds. No wheezing.  Abdominal:     General: Bowel sounds are normal. There is no distension.     Palpations: Abdomen is soft.  Musculoskeletal:        General: No deformity. Normal range of motion.     Cervical back: Normal range of motion and neck supple.  Skin:    General: Skin is warm and dry.     Findings: No erythema or rash.  Neurological:     Mental Status: She is alert and oriented to person, place, and time. Mental status is at baseline.  Psychiatric:        Mood and Affect: Mood normal.     LABORATORY DATA:  I have reviewed the data as listed    Latest Ref Rng & Units 03/30/2024    3:43 PM 11/07/2020    6:28 AM 11/03/2020   10:25 AM  CBC  WBC 4.0 - 10.5 K/uL 9.8  11.5  8.1   Hemoglobin 12.0 - 15.0 g/dL 86.1  8.3  89.6   Hematocrit  36.0 - 46.0 % 43.0  26.0  32.2   Platelets 150 - 400 K/uL 459  207  268       Latest Ref Rng & Units 03/30/2024    3:43 PM 11/03/2020   10:25 AM 04/28/2018    8:52 AM  CMP  Glucose 70 - 99 mg/dL 90  76  66   BUN 6 - 20 mg/dL 16  10  14    Creatinine 0.44 - 1.00 mg/dL 9.03  9.30  9.46   Sodium 135 - 145 mmol/L 135  138  135   Potassium 3.5 - 5.1 mmol/L 3.6  3.7  3.5    Chloride 98 - 111 mmol/L 102  108  106   CO2 22 - 32 mmol/L 24  20  20    Calcium  8.9 - 10.3 mg/dL 9.2  8.6  8.5   Total Protein 6.5 - 8.1 g/dL 9.3   6.9   Total Bilirubin 0.0 - 1.2 mg/dL 0.6   1.2   Alkaline Phos 38 - 126 U/L 83   114   AST 15 - 41 U/L 23   17   ALT 0 - 44 U/L 24   9       RADIOGRAPHIC STUDIES: I have personally reviewed the radiological images as listed and agreed with the findings in the report. No results found.

## 2024-04-02 LAB — KAPPA/LAMBDA LIGHT CHAINS
Kappa free light chain: 33.8 mg/L — ABNORMAL HIGH (ref 3.3–19.4)
Kappa, lambda light chain ratio: 1.12 (ref 0.26–1.65)
Lambda free light chains: 30.2 mg/L — ABNORMAL HIGH (ref 5.7–26.3)

## 2024-04-05 LAB — MULTIPLE MYELOMA PANEL, SERUM
Albumin SerPl Elph-Mcnc: 3.6 g/dL (ref 2.9–4.4)
Albumin/Glob SerPl: 0.9 (ref 0.7–1.7)
Alpha 1: 0.3 g/dL (ref 0.0–0.4)
Alpha2 Glob SerPl Elph-Mcnc: 0.8 g/dL (ref 0.4–1.0)
B-Globulin SerPl Elph-Mcnc: 1.4 g/dL — ABNORMAL HIGH (ref 0.7–1.3)
Gamma Glob SerPl Elph-Mcnc: 1.9 g/dL — ABNORMAL HIGH (ref 0.4–1.8)
Globulin, Total: 4.4 g/dL — ABNORMAL HIGH (ref 2.2–3.9)
IgA: 420 mg/dL — ABNORMAL HIGH (ref 87–352)
IgG (Immunoglobin G), Serum: 1903 mg/dL — ABNORMAL HIGH (ref 586–1602)
IgM (Immunoglobulin M), Srm: 277 mg/dL — ABNORMAL HIGH (ref 26–217)
Total Protein ELP: 8 g/dL (ref 6.0–8.5)

## 2024-04-20 ENCOUNTER — Other Ambulatory Visit: Payer: Self-pay

## 2024-04-20 ENCOUNTER — Telehealth: Payer: Self-pay

## 2024-04-20 DIAGNOSIS — R778 Other specified abnormalities of plasma proteins: Secondary | ICD-10-CM

## 2024-04-20 NOTE — Telephone Encounter (Signed)
 Per Dr. Babara, labs are fine overall. She does recommend 24 hr urine collection at least 8 days prior to MD visit in Dec. Spoke to pt and informed her of this also. Advised pt to come and pick up 24 hr urine jug. Pt advised understanding and said she will be coming this week to pick this up.

## 2024-04-20 NOTE — Telephone Encounter (Signed)
 Patient requesting a call to discuss lab results drawn at 03/30/24 appt.

## 2024-05-10 ENCOUNTER — Other Ambulatory Visit: Payer: Self-pay

## 2024-05-10 DIAGNOSIS — R778 Other specified abnormalities of plasma proteins: Secondary | ICD-10-CM

## 2024-05-10 DIAGNOSIS — F17210 Nicotine dependence, cigarettes, uncomplicated: Secondary | ICD-10-CM | POA: Diagnosis not present

## 2024-05-10 DIAGNOSIS — R7689 Other specified abnormal immunological findings in serum: Secondary | ICD-10-CM | POA: Insufficient documentation

## 2024-05-10 DIAGNOSIS — R771 Abnormality of globulin: Secondary | ICD-10-CM | POA: Insufficient documentation

## 2024-05-13 LAB — IFE+PROTEIN ELECTRO, 24-HR UR
% BETA, Urine: 28.4 %
ALPHA 1 URINE: 4 %
Albumin, U: 33 %
Alpha 2, Urine: 16.8 %
GAMMA GLOBULIN URINE: 17.9 %
Total Protein, Urine-Ur/day: 95 mg/(24.h) (ref 30–150)
Total Protein, Urine: 11.9 mg/dL
Total Volume: 800

## 2024-05-17 ENCOUNTER — Inpatient Hospital Stay: Attending: Oncology | Admitting: Oncology

## 2024-05-17 ENCOUNTER — Encounter: Payer: Self-pay | Admitting: Oncology

## 2024-05-17 VITALS — BP 116/84 | HR 91 | Temp 98.6°F | Resp 20 | Wt 283.5 lb

## 2024-05-17 DIAGNOSIS — R771 Abnormality of globulin: Secondary | ICD-10-CM | POA: Diagnosis not present

## 2024-05-17 DIAGNOSIS — Z8659 Personal history of other mental and behavioral disorders: Secondary | ICD-10-CM | POA: Insufficient documentation

## 2024-05-17 DIAGNOSIS — O99213 Obesity complicating pregnancy, third trimester: Secondary | ICD-10-CM | POA: Insufficient documentation

## 2024-05-17 NOTE — Assessment & Plan Note (Signed)
 SPEP showed no M protein. Increase gamma globulin.  Polyclonal increase immunoglobulins indicate chronic inflammation.  Normal light chain ratio. 24 hour UPEP is negative for M protein.  No evidence of plasma cell disorders. Likely  Recommend patient to continue follow up with primary care provider for further work up.

## 2024-05-17 NOTE — Progress Notes (Signed)
 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 Increase gamma globulin.  Polyclonal increase immunoglobulins indicate chronic inflammation.  Normal light chain ratio. 24 hour UPEP is negative for M protein.  No evidence of plasma cell disorders. Likely  Recommend patient to continue follow up with primary care provider for further work up.   Follow up PRN All questions were answered. The patient knows to call the clinic with any problems, questions or concerns.  Zelphia Cap, MD, PhD [facility] Hematology Oncology 05/17/2024   HISTORY OF PRESENTING ILLNESS:   Pamela Robertson is a  33 y.o.  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   She was referred by her primary care doctor for evaluation of elevated protein levels found on a protein electrophoresis test conducted on September 3rd.  She generally feels well at baseline and has no known autoimmune diseases. No night sweats, low-grade fevers, or chronic joint pain. Occasionally experiences aching in her shoulders and arms, but no joint swelling, stiffness, or difficulty forming a fist. No chronic sinus issues, skin rashes, or chronic wounds that are not healing.  She experiences restlessness at night, leading to poor sleep quality. By the time she falls asleep, it is often time to wake up to prepare her children for school and their therapies.  She has a history of irregular menstrual periods, with the longest duration of amenorrhea occurring after her last child, who is now [age] years old.  She has not had a period since then.  INTERVAL HISTORY Pamela Robertson is a 33 y.o. female who has above history reviewed by me today presents for follow up visit for hyperglobulinemia.  She presents to discuss results.   MEDICAL HISTORY:  Past Medical History:  Diagnosis Date   Anemia    h/o   Bipolar 1 disorder, depressed, full remission    controlled with lamictal  and prozac    Depression 2010   hosp for suical ideation at [facility]    Menstrual irregularity    occurring every 2 or 3 months    SURGICAL HISTORY: Past Surgical History:  Procedure Laterality Date   CESAREAN SECTION  04/28/2016   Procedure: CESAREAN SECTION;  Surgeon: Debby JINNY Dinsmore, MD;  Location: [facility] ORS;  Service: Obstetrics;;   CESAREAN SECTION N/A 04/28/2018   Procedure: CESAREAN SECTION, repeat;  Surgeon: Dinsmore Debby JINNY, MD;  Location: [facility] ORS;  Service: Obstetrics;  Laterality: N/A;  TOB 1125 APGAR 8/9 7lb 5oz   CESAREAN SECTION WITH BILATERAL TUBAL LIGATION Bilateral 11/06/2020   Procedure: REPEAT CESAREAN SECTION WITH BILATERAL TUBAL LIGATION;  Surgeon: Dinsmore Debby JINNY, MD;  Location: [facility] ORS;  Service: Obstetrics;  Laterality: Bilateral;   EXCISION OF KELOID  04/28/2018   Procedure: Removal of cicatrix;  Surgeon: Schermerhorn, Debby JINNY, MD;  Location: [facility] ORS;  Service: Obstetrics;;    SOCIAL HISTORY: Social History   Socioeconomic History   Marital status: Single    Spouse name: DJ   Number of children: Not on file   Years of education: Not on file   Highest education level: Not on file  Occupational History   Not on file  Tobacco Use   Smoking status: Some Days    Current packs/day: 0.00    Average packs/day: 0.1 packs/day    Types: Cigarettes    Last attempt to quit: 05/04/2011    Years since quitting: 13.0   Smokeless tobacco: Never   Tobacco comments:    not daily,  less than pack/month  Vaping Use   Vaping status: Never Used  Substance and Sexual  Activity   Alcohol use: No    Comment: last use 06/30/15   Drug use: No   Sexual activity: Not Currently    Partners: Male    Birth control/protection: Surgical  Other Topics Concern   Not on file  Social History Narrative   Not on file   Social Drivers of Health   Tobacco Use: High Risk (05/17/2024)   Patient History    Smoking Tobacco Use: Some Days    Smokeless Tobacco Use: Never    Passive Exposure: Not on file  Financial Resource Strain: Low Risk  (02/04/2024)   Received from [facility]   Overall Financial Resource Strain (CARDIA)    Difficulty of Paying Living Expenses: Not very hard  Food Insecurity: No Food Insecurity (03/30/2024)   Epic    Worried About Programme Researcher, Broadcasting/film/video in the Last Year: Never true    Ran Out of Food in the Last Year: Never true  Recent Concern: Food Insecurity - Food Insecurity Present (02/04/2024)   Received from [facility]   Epic    Within the past 12 months, you worried that your food would run out before you got the money to buy more.: Sometimes true    Within the past 12 months, the food you bought just didn't last and you didn't have money to get more.: Sometimes true  Transportation Needs: No Transportation Needs (03/30/2024)   Epic    Lack of Transportation (Medical): No    Lack of Transportation (Non-Medical): No  Physical Activity: Not on file  Stress: Not on file  Social Connections: Not on file  Intimate Partner Violence: Not At Risk (03/30/2024)   Epic    Fear of Current or Ex-Partner: No    Emotionally Abused: No    Physically Abused: No    Sexually Abused: No  Depression (PHQ2-9): Low Risk (03/30/2024)   Depression (PHQ2-9)    PHQ-2 Score: 0  Alcohol Screen: Not on file  Housing: Low Risk (03/30/2024)   Epic    Unable to Pay for Housing in the Last Year: No    Number of Times Moved in the Last Year: 0    Homeless in the Last Year: No  Utilities: Not At Risk (03/30/2024)   Epic     Threatened with loss of utilities: No  Health Literacy: Not on file    FAMILY HISTORY: Family History  Problem Relation Age of Onset   Diabetes Father     ALLERGIES:  has no known allergies.  MEDICATIONS:  Current Outpatient Medications  Medication Sig Dispense Refill   escitalopram (LEXAPRO) 10 MG tablet Take 10 mg by mouth daily.     ibuprofen  (ADVIL ) 800 MG tablet Take 800 mg by mouth.     topiramate (TOPAMAX) 25 MG tablet Take 25 mg by mouth at bedtime.     Vitamin D, Ergocalciferol, (DRISDOL) 1.25 MG (50000 UNIT) CAPS capsule Take 50,000 Units by mouth once a week.     ferrous sulfate  325 (65 FE)  MG tablet Take 1 tablet (325 mg total) by mouth 2 (two) times daily with a meal. (Patient not taking: Reported on 05/17/2024) 60 tablet 3   oxyCODONE  (OXY IR/ROXICODONE ) 5 MG immediate release tablet Take 1 tablet (5 mg total) by mouth every 4 (four) hours as needed for moderate pain. (Patient not taking: Reported on 05/17/2024) 30 tablet 0   Prenat-FeAsp-Meth-FA-DHA w/o A (PRENATE PIXIE) 10-0.6-0.4-200 MG CAPS Take 1 capsule by mouth daily. (Patient not taking: Reported on 05/17/2024)     No current facility-administered medications for this visit.    Review of Systems  Constitutional:  Negative for appetite change, chills, fatigue and fever.  HENT:   Negative for hearing loss and voice change.   Eyes:  Negative for eye problems.  Respiratory:  Negative for chest tightness and cough.   Cardiovascular:  Negative for chest pain.  Gastrointestinal:  Negative for abdominal distention, abdominal pain and blood in stool.  Endocrine: Negative for hot flashes.  Genitourinary:  Negative for difficulty urinating and frequency.   Musculoskeletal:  Negative for arthralgias.  Skin:  Negative for itching and rash.  Neurological:  Negative for extremity weakness.  Hematological:  Negative for adenopathy.  Psychiatric/Behavioral:  Negative for confusion.    PHYSICAL EXAMINATION:  Vitals:    05/17/24 1400  BP: 116/84  Pulse: 91  Resp: 20  Temp: 98.6 F (37 C)  SpO2: 100%   Filed Weights   05/17/24 1400  Weight: 283 lb 8 oz (128.6 kg)    Physical Exam Constitutional:      General: She is not in acute distress. HENT:     Head: Normocephalic and atraumatic.  Eyes:     General: No scleral icterus. Cardiovascular:     Rate and Rhythm: Normal rate.  Pulmonary:     Effort: Pulmonary effort is normal. No respiratory distress.  Abdominal:     General: There is no distension.  Musculoskeletal:        General: Normal range of motion.     Cervical back: Normal range of motion.  Skin:    Findings: No erythema or rash.  Neurological:     Mental Status: She is alert and oriented to person, place, and time. Mental status is at baseline.  Psychiatric:        Mood and Affect: Mood normal.     LABORATORY DATA:  I have reviewed the data as listed    Latest Ref Rng & Units 03/30/2024    3:43 PM 11/07/2020    6:28 AM 11/03/2020   10:25 AM  CBC  WBC 4.0 - 10.5 K/uL 9.8  11.5  8.1   Hemoglobin 12.0 - 15.0 g/dL 86.1  8.3  89.6   Hematocrit 36.0 - 46.0 % 43.0  26.0  32.2   Platelets 150 - 400 K/uL 459  207  268       Latest Ref Rng & Units 03/30/2024    3:43 PM 11/03/2020   10:25 AM 04/28/2018    8:52 AM  CMP  Glucose 70 - 99 mg/dL 90  76  66   BUN 6 - 20 mg/dL 16  10  14    Creatinine 0.44 - 1.00 mg/dL 9.03  9.30  9.46   Sodium 135 - 145 mmol/L 135  138  135   Potassium 3.5 - 5.1 mmol/L 3.6  3.7  3.5   Chloride 98 - 111 mmol/L 102  108  106   CO2 22 - 32 mmol/L 24  20  20  Calcium  8.9 - 10.3 mg/dL 9.2  8.6  8.5   Total Protein 6.5 - 8.1 g/dL 9.3   6.9   Total Bilirubin 0.0 - 1.2 mg/dL 0.6   1.2   Alkaline Phos 38 - 126 U/L 83   114   AST 15 - 41 U/L 23   17   ALT 0 - 44 U/L 24   9       RADIOGRAPHIC STUDIES: I have personally reviewed the radiological images as listed and agreed with the findings in the report. No results found.

## 2024-06-15 ENCOUNTER — Other Ambulatory Visit: Payer: Self-pay | Admitting: Medical Genetics

## 2024-06-16 ENCOUNTER — Other Ambulatory Visit
Admission: RE | Admit: 2024-06-16 | Discharge: 2024-06-16 | Disposition: A | Discharge status: Home / Self Care | Payer: Self-pay | Source: Ambulatory Visit | Attending: Medical Genetics | Admitting: Medical Genetics

## 2024-06-25 LAB — GENECONNECT MOLECULAR SCREEN: Genetic Analysis Overall Interpretation: NEGATIVE
